# Patient Record
Sex: Female | Born: 1975 | Race: White | Hispanic: No | Marital: Married | Smoking: Never smoker
Health system: Southern US, Community
[De-identification: ages and names within clinical notes are randomized; demographics above are authoritative.]

## PROBLEM LIST (undated history)

## (undated) DIAGNOSIS — M1990 Unspecified osteoarthritis, unspecified site: Secondary | ICD-10-CM

## (undated) DIAGNOSIS — R519 Headache, unspecified: Secondary | ICD-10-CM

## (undated) DIAGNOSIS — Z789 Other specified health status: Secondary | ICD-10-CM

## (undated) DIAGNOSIS — R51 Headache: Secondary | ICD-10-CM

## (undated) HISTORY — DX: Unspecified osteoarthritis, unspecified site: M19.90

## (undated) HISTORY — PX: KNEE ARTHROSCOPY W/ ACL RECONSTRUCTION: SHX1858

## (undated) HISTORY — PX: TUBAL LIGATION: SHX77

---

## 1998-02-26 ENCOUNTER — Encounter: Admission: RE | Admit: 1998-02-26 | Discharge: 1998-02-26 | Payer: Self-pay | Admitting: Sports Medicine

## 1998-04-04 ENCOUNTER — Encounter: Admission: RE | Admit: 1998-04-04 | Discharge: 1998-04-04 | Payer: Self-pay | Admitting: Family Medicine

## 1998-05-08 ENCOUNTER — Encounter: Admission: RE | Admit: 1998-05-08 | Discharge: 1998-05-08 | Payer: Self-pay | Admitting: Family Medicine

## 1998-06-25 ENCOUNTER — Encounter: Admission: RE | Admit: 1998-06-25 | Discharge: 1998-06-25 | Payer: Self-pay | Admitting: Family Medicine

## 1998-09-25 ENCOUNTER — Encounter: Admission: RE | Admit: 1998-09-25 | Discharge: 1998-09-25 | Payer: Self-pay | Admitting: Family Medicine

## 1999-03-19 ENCOUNTER — Other Ambulatory Visit: Admission: RE | Admit: 1999-03-19 | Discharge: 1999-03-19 | Payer: Self-pay | Admitting: Obstetrics & Gynecology

## 1999-03-24 ENCOUNTER — Ambulatory Visit (HOSPITAL_COMMUNITY): Admission: RE | Admit: 1999-03-24 | Discharge: 1999-03-24 | Payer: Self-pay | Admitting: Obstetrics & Gynecology

## 1999-03-24 ENCOUNTER — Encounter: Payer: Self-pay | Admitting: Obstetrics & Gynecology

## 1999-08-26 ENCOUNTER — Encounter: Payer: Self-pay | Admitting: Obstetrics and Gynecology

## 1999-08-26 ENCOUNTER — Ambulatory Visit (HOSPITAL_COMMUNITY): Admission: RE | Admit: 1999-08-26 | Discharge: 1999-08-26 | Payer: Self-pay | Admitting: Obstetrics and Gynecology

## 2000-01-14 ENCOUNTER — Inpatient Hospital Stay (HOSPITAL_COMMUNITY): Admission: AD | Admit: 2000-01-14 | Discharge: 2000-01-17 | Payer: Self-pay | Admitting: Obstetrics & Gynecology

## 2000-03-23 ENCOUNTER — Other Ambulatory Visit: Admission: RE | Admit: 2000-03-23 | Discharge: 2000-03-23 | Payer: Self-pay | Admitting: Obstetrics & Gynecology

## 2000-12-14 ENCOUNTER — Emergency Department (HOSPITAL_COMMUNITY): Admission: EM | Admit: 2000-12-14 | Discharge: 2000-12-14 | Payer: Self-pay | Admitting: Emergency Medicine

## 2001-03-22 ENCOUNTER — Other Ambulatory Visit: Admission: RE | Admit: 2001-03-22 | Discharge: 2001-03-22 | Payer: Self-pay | Admitting: Obstetrics & Gynecology

## 2001-08-25 ENCOUNTER — Ambulatory Visit (HOSPITAL_COMMUNITY): Admission: RE | Admit: 2001-08-25 | Discharge: 2001-08-25 | Payer: Self-pay | Admitting: Obstetrics & Gynecology

## 2001-08-25 ENCOUNTER — Encounter: Payer: Self-pay | Admitting: Obstetrics & Gynecology

## 2001-12-31 ENCOUNTER — Inpatient Hospital Stay (HOSPITAL_COMMUNITY): Admission: AD | Admit: 2001-12-31 | Discharge: 2002-01-03 | Payer: Self-pay | Admitting: Obstetrics and Gynecology

## 2002-07-18 ENCOUNTER — Other Ambulatory Visit: Admission: RE | Admit: 2002-07-18 | Discharge: 2002-07-18 | Payer: Self-pay | Admitting: Obstetrics and Gynecology

## 2002-10-11 ENCOUNTER — Ambulatory Visit (HOSPITAL_COMMUNITY): Admission: RE | Admit: 2002-10-11 | Discharge: 2002-10-11 | Payer: Self-pay | Admitting: Obstetrics & Gynecology

## 2002-10-11 ENCOUNTER — Encounter: Payer: Self-pay | Admitting: Obstetrics & Gynecology

## 2003-02-13 ENCOUNTER — Encounter (INDEPENDENT_AMBULATORY_CARE_PROVIDER_SITE_OTHER): Payer: Self-pay | Admitting: *Deleted

## 2003-02-13 ENCOUNTER — Inpatient Hospital Stay (HOSPITAL_COMMUNITY): Admission: AD | Admit: 2003-02-13 | Discharge: 2003-02-16 | Payer: Self-pay | Admitting: Obstetrics & Gynecology

## 2004-07-22 ENCOUNTER — Emergency Department (HOSPITAL_COMMUNITY): Admission: AC | Admit: 2004-07-22 | Discharge: 2004-07-22 | Payer: Self-pay

## 2004-11-08 ENCOUNTER — Emergency Department (HOSPITAL_COMMUNITY): Admission: EM | Admit: 2004-11-08 | Discharge: 2004-11-09 | Payer: Self-pay | Admitting: Emergency Medicine

## 2006-12-31 ENCOUNTER — Emergency Department (HOSPITAL_COMMUNITY): Admission: EM | Admit: 2006-12-31 | Discharge: 2006-12-31 | Payer: Self-pay | Admitting: *Deleted

## 2009-03-02 ENCOUNTER — Emergency Department (HOSPITAL_COMMUNITY): Admission: EM | Admit: 2009-03-02 | Discharge: 2009-03-02 | Payer: Self-pay | Admitting: Emergency Medicine

## 2009-04-29 ENCOUNTER — Encounter: Admission: RE | Admit: 2009-04-29 | Discharge: 2009-04-29 | Payer: Self-pay | Admitting: Family Medicine

## 2010-06-09 LAB — CBC
Hemoglobin: 12.9 g/dL (ref 12.0–15.0)
RBC: 4.38 MIL/uL (ref 3.87–5.11)

## 2010-06-09 LAB — URINALYSIS, ROUTINE W REFLEX MICROSCOPIC
Bilirubin Urine: NEGATIVE
Ketones, ur: NEGATIVE mg/dL
Nitrite: NEGATIVE
Protein, ur: 30 mg/dL — AB
Urobilinogen, UA: 1 mg/dL (ref 0.0–1.0)

## 2010-06-09 LAB — COMPREHENSIVE METABOLIC PANEL
ALT: 16 U/L (ref 0–35)
AST: 18 U/L (ref 0–37)
Alkaline Phosphatase: 57 U/L (ref 39–117)
BUN: 12 mg/dL (ref 6–23)
CO2: 25 mEq/L (ref 19–32)
Chloride: 105 mEq/L (ref 96–112)
GFR calc non Af Amer: >60 mL/min (ref >60)
Glucose, Bld: 101 mg/dL — ABNORMAL HIGH (ref 70–99)
Total Bilirubin: 0.4 mg/dL (ref 0.3–1.2)
Total Protein: 6.8 g/dL (ref 6.0–8.3)

## 2010-06-09 LAB — URINE MICROSCOPIC-ADD ON

## 2010-06-09 LAB — DIFFERENTIAL
Eosinophils Relative: 0 % (ref 0–5)
Monocytes Absolute: 0.2 10*3/uL (ref 0.1–1.0)
Neutro Abs: 6.1 10*3/uL (ref 1.7–7.7)
Neutrophils Relative %: 92 % — ABNORMAL HIGH (ref 43–77)

## 2010-06-09 LAB — POCT PREGNANCY, URINE: Preg Test, Ur: NEGATIVE

## 2010-07-25 NOTE — Discharge Summary (Signed)
[facility]  Patient:    Kaitlin Cole, Kaitlin Cole                      MRN: [id number] Adm. Date:  [id number] Disc. Date: [id number] Attending:  Mickle Mallory Dictator:   Leilani Able, P.A.                           Discharge Summary  FINAL DIAGNOSES:              Intrauterine pregnancy at term, history of previous cesarean section, desires repeat cesarean section, morbid obesity.  PROCEDURE:                    Repeat low transverse cesarean section.  SURGEON:                      Gerrit Friends. Aldona Bar, M.D.  ASSISTANT:                    Luvenia Redden, M.D.  COMPLICATIONS:                None.                                This 35 year old G2, P1 presents on November 7 for repeat cesarean section.  Patient had had a history of prior cesarean section with her last pregnancy in 1995 secondary to arrest of dilation. Patients prenatal course had been uncomplicated.  This was a Clomid pregnancy and patients rubella status was not immune.  She was taken to the operating room on January 14, 2000 by Dr. Annamaria Helling where a repeat low transverse cesarean section was performed with the delivery of a 7 pound 15 ounce female infant with Apgars of 9 and 9.  Delivery went without complication.  Patients postoperative course was benign without significant fevers.  Patient did receive her rubella vaccine prior to discharge.  Patient was sent home on a regular diet.  Told to decrease activity.  Told to continue prenatal vitamins and FeSo4 325 mg one b.i.d.  Was given Motrin 600 mg one q.6h.  Was also given erythromycin 333 mg one t.i.d. x 7 days secondary to slight cellulitis of her incision.  She was told to return to the office in three days for staple removal and to call with any increased pain, fever, or drainage from her incision. DD:  02/13/00 TD:  02/13/00 Job: [id number]

## 2010-07-25 NOTE — Op Note (Signed)
Kaitlin Cole, Kaitlin Cole                         ACCOUNT NO.:  [id number]   MEDICAL RECORD NO.:  [id number]                   PATIENT TYPE:  INP   LOCATION:  9105                                 FACILITY:  [facility]   PHYSICIAN:  Gerrit Friends. Aldona Bar, M.D.                DATE OF BIRTH:  1975/07/08   DATE OF PROCEDURE:  12/31/2001  DATE OF DISCHARGE:                                 OPERATIVE REPORT   PREOPERATIVE DIAGNOSES:  1. Intrauterine pregnancy at 38-39 weeks.  2. Ruptured membranes.  3. Early active labor.  4. Previous cesarean section x2.  5. Morbid obesity.   POSTOPERATIVE DIAGNOSES:  1. Intrauterine pregnancy at 38-39 weeks.  2. Ruptured membranes.  3. Early active labor.  4. Previous cesarean section x2.  5. Morbid obesity.  6. Delivery of 8 pound 4 ounce female infant, Apgars 9 and 9.   PROCEDURE:  Repeat low transverse cesarean section.   SURGEON:  Gerrit Friends. Aldona Bar, M.D.   ASSISTANT:  Randye Lobo, M.D.   ANESTHESIA:  Spinal/epidural - Dr. Arby Barrette   INDICATIONS:  This gravida 3, para 34, 35 year old patient was scheduled for  a repeat cesarean section on January 03, 2002, but on the early morning of  December 31, 2001, encountered ruptured membranes with the onset of  contractions.  She presented to [facility] for evaluation and indeed  was found to have ruptured membranes and to be in early active labor.  Fetal  heart tracing was reassuring.   She was taken to the operating room for repeat cesarean section.   DESCRIPTION OF PROCEDURE:  In the operating room, Dr. Arby Barrette placed a  spinal and augmented this with placement of an epidural.  She was positioned  in the usual fashion and thereafter prepped and draped with a Foley catheter  inserted as part of the prep.  The panniculus was taped up to facilitate the  procedure.  Once the patient was adequately draped and good anesthetic  levels were noted, the procedure was begun.  A Pfannenstiel incision was  made  through the old scar and dissected down sharply to and through the  fascia in a low transverse fashion.  Subfascial space was created inferiorly  and superiorly and muscles separated in the midline and peritoneum  identified.  There were some adhesions encountered, mostly omental, and  these were taken down adequately.  Thereafter, the vesicouterine peritoneum  once the peritoneal cavity had been entered was incised in a low transverse  fashion and thereafter using the Metzenbaum scissors the uterine wall opened  in the low transverse fashion and extended laterally with the fingers.  Amniotomy, remaining fluid - was noted to be clear and thereafter with the  aid of the vacuum extractor, a viable 8 pound 4 ounce female infant was  delivered with minimal difficulty.  The baby had Apgars of 9 and 9.  After  the cord was  clamped and cut, the infant was passed off to the awaiting team  headed up by Dr. Alison Murray, and subsequently, the baby was taken to the nursery  in good condition.   Subsequent weight was found to be 8 pounds 4 ounces.   After the cord blood was collected, the placenta was delivered intact.  The  uterus was then exteriorized and manually rendered free of any remaining  products of conception.  The lower segment was noted to be somewhat thin as  expected.  Nonetheless it was closed in the usual fashion with #1 Vicryl in  a running locking fashion.  Several figure-of-eight #1 Vicryl were applied  for additional hemostasis.   At this time, with good uterine contractility noted and good uterine  hemostasis at the incision noted, tubes and ovaries were inspected and found  to be normal.  Abdomen was lavaged of all free blood and clot and then the  uterus was replaced into the abdominal cavity.  At this time, the counts  were noted to be correct and no foreign bodies were noted to be remaining in  the abdominal cavity.  Closure of the abdomen at this time was carried out  in layers.   The abdominal peritoneum was closed with 2-0 Vicryl in a running  fashion, muscles secured with same.  Assured of good subfascial hemostasis,  the fascia was then reapproximated from the angle to the midline bilaterally  using 0 Vicryl in a running fashion.   The subcutaneous tissue was rendered hemostatic and reapproximated with 2-0  plain in an interrupted subcuticular fashion.  Staples were then used to  close the skin, and a sterile pressure dressing was applied.  At this time,  the patient was transported to the recovery room in satisfactory condition  having tolerated the procedure well.  Estimated blood loss was 500 cc.  All  counts were correct x2.  At the conclusion of the procedure, both mother and  baby were doing well in their respective recovery areas.                                                Gerrit Friends. Aldona Bar, M.D.    RMW/MEDQ  D:  12/31/2001  T:  01/01/2002  Job:  [id number]

## 2010-07-25 NOTE — Op Note (Signed)
Kaitlin Cole, Kaitlin Cole                         ACCOUNT NO.:  [id number]   MEDICAL RECORD NO.:  [id number]                   PATIENT TYPE:  INP   LOCATION:  9131                                 FACILITY:  [facility]   PHYSICIAN:  Miguel Aschoff, M.D.                    DATE OF BIRTH:  1975/04/11   DATE OF PROCEDURE:  02/13/2003  DATE OF DISCHARGE:                                 OPERATIVE REPORT   PREOPERATIVE DIAGNOSES:  1. Intrauterine pregnancy at 38 weeks.  2. Spontaneous rupture of membranes.  3. Previous cesarean section.  4. Desired sterilization.  5. Positive group B Streptococcus.   POSTOPERATIVE DIAGNOSES:  1. Intrauterine pregnancy at 38 weeks.  2. Spontaneous rupture of membranes.  3. Previous cesarean section.  4. Desired sterilization.  5. Positive group B Streptococcus.  6. Delivery of a viable female infant, Apgar 8 and 9.   PROCEDURES:  1. Repeat low flap transverse cesarean section.  2. Bilateral Pomeroy tubal sterilization.   SURGEON:  Miguel Aschoff, M.D.   ASSISTANT:  Carrington Clamp, M.D.   ANESTHESIA:  Spinal.   COMPLICATIONS:  None.   JUSTIFICATION:  The patient is a 35 year old white female, gravida 5, para 3-  0-1-3, with an estimated date of confinement of March 03, 2003.  The  patient was scheduled for repeat cesarean section December 9.  She developed  spontaneous rupture of membranes on the morning of February 13, 2003.  Examination revealed grossly clear fluid.  The patient has a previous  history of being positive for group B strep, and this is being prophylaxed  with Clindamycin because of allergies to PENICILLIN.  She also has requested  that a permanent sterilization procedure be performed.  Informed consent has  been obtained.   DESCRIPTION OF PROCEDURE:  The patient was taken to the operating room and  placed in sitting position, and spinal anesthesia was administered without  difficulty.  She was then placed in the supine position deviated to  the left  and prepped and draped in the usual sterile fashion.  A Foley catheter was  inserted.  Once this was done her previous Pfannenstiel incision was re-  incised and extended down through the subcutaneous tissue.  Bleeding points  were clamped and coagulated as they were encountered.  The fascia was then  identified, incised transversely, and separated from the underlying rectus  muscles.  Rectus muscles were divided in the midline.  The peritoneum was  then identified and entered, carefully avoiding the underlying structures.  At this point a bladder flap was created, protected with a bladder blade,  then a typical transverse incision was made into the lower uterine segment.  The amniotic cavity was entered, clear fluid was obtained and with vacuum  extractor assistance, the patient was delivered of a viable female infant,  Apgar 8 at one minute and 9 at five minutes from an LOP position.  Nose and  mouth were suctioned and the baby handed to the pediatric team in  attendance.  Cord bloods were then obtained for appropriate studies.  The  placenta was delivered and then the uterus was evacuated of any remaining  products of conception.  At this point the angles of the uterine incision  were identified, ligated using figure-of-eight sutures of #1 Vicryl, then  the uterus was closed in layers.  The first layer was a running interlocking  suture of #1 Vicryl, followed by an imbricating suture of #1 Vicryl.  Once  this was done there was one area of oozing noted on the left outer third of  the uterine incision.  This was promptly brought under control using two  figure-of-eight sutures of #1 Vicryl.  Attention was then directed to the  right tube.  It was grasped in its midportion.  A knuckle of tube was thus  created and this knuckle of tube was then ligated using two ligatures of 0  plain gut.  The area above the ligatures was then excised and tubal stumps  were cauterized.  A small area  of oozing was noted, and this was grasped and  tied with ligature of 0 plain gut.  Hemostasis was then readily achieved.  The left tube was identified, grasped in its midportion, and a knuckle of  tube thus created, and then again this was doubly ligated with two ligatures  of 0 plain gut and the area above the ligatures excised and the tubal stumps  cauterized.  At this point with no other abnormalities being noted with good  hemostasis, lap counts and instrument counts were found to be correct and  then the abdomen was closed.  The parietal peritoneum was closed using  running and continuous 0 Vicryl suture, rectus muscles were reapproximated  using running and continuous 0 Vicryl suture, the fascia was then closed  using two sutures of 0 Vicryl at each side of the lateral fascial angles and  meeting in the midline.  The subcutaneous tissue was closed using  interrupted 0 plain gut sutures and the skin incision was closed using  staples.  The estimated blood loss was approximately 800 mL.  The patient  tolerated the procedure well and went to the recovery room in satisfactory  condition.                                               Miguel Aschoff, M.D.    AR/MEDQ  D:  02/13/2003  T:  02/14/2003  Job:  [id number]

## 2010-07-25 NOTE — Op Note (Signed)
[facility]  Patient:    Kaitlin Cole, Kaitlin Cole                      MRN: [id number] Proc. Date: 01/14/00 Adm. Date:  [id number] Attending:  Mickle Mallory                           Operative Report  PREOPERATIVE DIAGNOSES:       Term pregnancy, previous cesarean section, desire for repeat cesarean section, morbid obesity.  POSTOPERATIVE DIAGNOSES:      Term pregnancy, previous cesarean section, desire for repeat cesarean section, morbid obesity, delivery of 7 pound 15 ounce female infant with Apgars 8 and 9.  PROCEDURE:                    Repeat low transverse cesarean section.  SURGEON:                      Gerrit Friends. Aldona Bar, M.D.  ASSISTANT:                    Luvenia Redden, M.D.  ANESTHESIA:                   Spinal-Dr. Arby Barrette.  PROCEDURE:                    Patient was taken to the operating room and after satisfactory induction of spinal anesthesia she was prepped and draped having placed in a supine position slightly tilted to the left.  A Foley catheter was inserted as part of the prep.                                After the patient was adequately draped, anesthetic levels were checked and found to be adequate.  At this time procedure was begun.                                A Pfannenstiel incision was made and with minimal difficulty dissected down to and through the fascia in a low transverse fashion.  Hemostasis was created at each layer.                                The subfascial space was created inferiorly and superiorly.  Muscles separated in the midline.  Peritoneum identified and entered appropriately with care taken to avoid the bowel superiorly and the bladder inferiorly.  At this time the vesicouterine peritoneum was incised in a low transverse fashion and pushed off the lower uterine segment with ease. Sharp incision with the use of Metzenbaum scissors was then made in the low transverse fashion, extended with the  fingers.  Amniotomy was produced with production of clear fluid.  The vertex was presenting, but floating.  One attempt was made to use the vacuum extractor but it released prematurely so therefore the short Simpson forceps were placed and with minimal difficulty thereafter delivery of a viable female infant was carried out.  Apgars were noted to be 8 and 9.  After the cord was clamped and cut the infant was passed off to the awaiting team.  Subsequent weight was noted to be 7 pounds 15 ounces and  infant was taken to the nursery in good condition.                                The umbilical cord appeared normal; had two arteries and one vein and after cord blood was collected placenta was delivered intact.  The uterus was then exteriorized.  It was manually rendered free of any remaining products of conception.  Good contractility was afforded with slowly giving intravenous Pitocin and manual stimulation.  Closure of the uterine incision was then carried out using #1 Vicryl in a running locking fashion and this was reinforced in the midline with a figure-of-eight #1 Vicryl with adequate hemostasis.  At this time the uterus was well contracted, uterine incision dry.  Tubes and ovaries inspected and noted to be normal. Cul-de-sac freed of all blood and clot.  Uterus replaced into the abdominal cavity.  After all counts were noted to be correct and no foreign bodies were noted to be remaining in the abdominal cavity closure of the abdomen was then carried out in layers.  The abdominal peritoneum was closed with 2-0 Vicryl in a running fashion.  Subfascial spaces rendered hemostatic and then fascia was closed with 0 Vicryl from angle to midline bilaterally.  ______ was rendered hemostatic and reapproximated with 2-0 plain in an interrupted fashion and thereafter staples were applied and thereafter a sterile pressure dressing was applied.  The patient at this time was transported to the  recovery area in satisfactory condition having tolerated procedure well.  Estimated blood loss 500 cc.  All counts correct x 2.  Patient had good clear urine output during the procedure.  At the conclusion of the procedure both mother and baby were doing well in their respective recovery areas. DD:  01/14/00 TD:  01/14/00 Job: [id number]

## 2010-07-25 NOTE — Discharge Summary (Signed)
   Kaitlin Cole, Kaitlin Cole                         ACCOUNT NO.:  [id number]   MEDICAL RECORD NO.:  [id number]                   PATIENT TYPE:  INP   LOCATION:  9105                                 FACILITY:  [facility]   PHYSICIAN:  Malva Limes, M.D.                 DATE OF BIRTH:  1975-05-14   DATE OF ADMISSION:  12/31/2001  DATE OF DISCHARGE:  01/03/2002                                 DISCHARGE SUMMARY   FINAL DIAGNOSES:  1. Intrauterine pregnancy at 38+ weeks gestation.  2. History of previous cesarean section x2; desires repeat cesarean section.  3. Active labor.  4. Morbid obesity.  5. Delivery of  female infant with Apgars of 9 and 9.   PROCEDURE:  Repeat low transverse cesarean section.   SURGEON:  Gerrit Friends. Aldona Bar, M.D.   ASSISTANT:  Randye Lobo, M.D.   COMPLICATIONS:  None.   HOSPITAL COURSE:  This 35 year old G3 P2-0-0-2 presents at 38+ weeks with  ruptured membranes in active labor.  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  She had morbid obesity and a PENICILLIN allergy.  Upon admission  the patient desired repeat cesarean section and the risks and benefits were  discussed with the patient.  She was taken to the operating room by Dr. Aldona Bar  where a repeat low transverse cesarean section was performed with the  delivery of an 8 pound  4 ounce female infant with Apgars of 9 and 9.  The delivery went without  complication.  The patient's postoperative course was benign without  significant fevers.  She was felt ready for discharge on postoperative day  #3.   DISPOSITION:  1. Sent home on a regular diet.  2. Told to decrease activities.  3. Told to continue prenatal vitamins.  4. Given a prescription for Tylox one to two q.4h. as needed for pain.  5. Told to follow up in the office in four weeks.   Her little boy was circumcised before discharge.     Leilani Able, P.A.-C.                 Malva Limes, M.D.    MB/MEDQ  D:  02/06/2002  T:  02/07/2002  Job:  [id number]

## 2010-07-25 NOTE — Discharge Summary (Signed)
NAMERONESHIA, DREW                         ACCOUNT NO.:  [id number]   MEDICAL RECORD NO.:  [id number]                   PATIENT TYPE:  INP   LOCATION:  9131                                 FACILITY:  [facility]   PHYSICIAN:  Carrington Clamp, M.D.              DATE OF BIRTH:  Oct 09, 1975   DATE OF ADMISSION:  02/13/2003  DATE OF DISCHARGE:  02/16/2003                                 DISCHARGE SUMMARY   FINAL DIAGNOSES:  1. Intrauterine pregnancy at [redacted] weeks gestation.  2. Spontaneous rupture of membranes.  3. History of previous cesarean section desires repeat cesarean section and     desires permanent elective sterilization.  4. Positive group B streptococcus culture.   PROCEDURE:  Repeat low flap transverse cesarean section and a bilateral  Pomeroy tubal sterilization procedure.  Surgeon Dr. Miguel Aschoff.  Assistant  Dr. Carrington Clamp.  Complications none.   HOSPITAL COURSE:  This 35 year old G5, P3-0-1-3 presents at [redacted] weeks  gestation with spontaneous rupture of membranes.  Patient had a positive  group B strep culture performed in the office and was started on  clindamycin.  She expressed her continued desire for a postpartum tubal  ligation.  Patient's antepartum course had been complicated by her history  of three cesarean sections, her positive group B strep; otherwise, patient's  antepartum course had been uncomplicated.  She was taken to the operating  room on February 13, 2003 by Dr. Miguel Aschoff where a repeat low transverse  cesarean section was performed with the delivery of an 8-pound 1-ounce female  infant with Apgars of 8 and 9, the delivery went without complications.  Patient's postoperative course was benign without significant fever, she was  felt ready for discharge on postoperative day #3, she was sent home on a  regular diet, told to decrease activities, told to continue prenatal  vitamins, was given Percocet one to two every 4 hours as needed for pain,  told she  could use over-the-counter ibuprofen as needed, was to follow up in  the office in 4 weeks.   LABORATORIES ON DISCHARGE:  Patient had a hemoglobin of 10.6, white blood  cell count of 10.     Leilani Able, P.A.-C.                Carrington Clamp, M.D.    MB/MEDQ  D:  03/19/2003  T:  03/19/2003  Job:  [id number]

## 2010-12-17 LAB — CBC
HCT: 39.9
MCHC: 33.7
Platelets: 279
RBC: 4.67
RDW: 12.3
WBC: 6.2

## 2010-12-17 LAB — URINALYSIS, ROUTINE W REFLEX MICROSCOPIC
Specific Gravity, Urine: 1.013
Urobilinogen, UA: 0.2

## 2010-12-17 LAB — I-STAT 8, (EC8 V) (CONVERTED LAB)
Chloride: 106
Glucose, Bld: 94
Operator id: 272551
Potassium: 3.8
Sodium: 140
pCO2, Ven: 43.3 — ABNORMAL LOW
pH, Ven: 7.402 — ABNORMAL HIGH

## 2010-12-17 LAB — POCT CARDIAC MARKERS: Troponin i, poc: <0.05

## 2010-12-17 LAB — DIFFERENTIAL
Basophils Absolute: 0
Eosinophils Absolute: 0.1
Eosinophils Relative: 1
Lymphocytes Relative: 30
Monocytes Absolute: 0.4
Neutrophils Relative %: 61

## 2010-12-17 LAB — POCT PREGNANCY, URINE
Operator id: 272551
Preg Test, Ur: NEGATIVE

## 2011-10-02 ENCOUNTER — Inpatient Hospital Stay (HOSPITAL_COMMUNITY): Payer: Self-pay

## 2011-10-02 ENCOUNTER — Encounter (HOSPITAL_COMMUNITY): Payer: Self-pay | Admitting: *Deleted

## 2011-10-02 ENCOUNTER — Inpatient Hospital Stay (HOSPITAL_COMMUNITY)
Admission: AD | Admit: 2011-10-02 | Discharge: 2011-10-02 | Disposition: A | Discharge status: Home / Self Care | Payer: Self-pay | Source: Ambulatory Visit | Attending: Obstetrics & Gynecology | Admitting: Obstetrics & Gynecology

## 2011-10-02 DIAGNOSIS — R109 Unspecified abdominal pain: Secondary | ICD-10-CM | POA: Diagnosis present

## 2011-10-02 DIAGNOSIS — Z98891 History of uterine scar from previous surgery: Secondary | ICD-10-CM

## 2011-10-02 DIAGNOSIS — N643 Galactorrhea not associated with childbirth: Secondary | ICD-10-CM | POA: Insufficient documentation

## 2011-10-02 DIAGNOSIS — R102 Pelvic and perineal pain: Secondary | ICD-10-CM

## 2011-10-02 HISTORY — DX: Other specified health status: Z78.9

## 2011-10-02 LAB — URINALYSIS, ROUTINE W REFLEX MICROSCOPIC
Bilirubin Urine: NEGATIVE
Glucose, UA: NEGATIVE mg/dL
Nitrite: NEGATIVE
Protein, ur: NEGATIVE mg/dL
Urobilinogen, UA: 0.2 mg/dL (ref 0.0–1.0)
pH: 5.5 (ref 5.0–8.0)

## 2011-10-02 LAB — CBC WITH DIFFERENTIAL/PLATELET
Eosinophils Absolute: 0.1 10*3/uL (ref 0.0–0.7)
Eosinophils Relative: 2 % (ref 0–5)
Hemoglobin: 12.3 g/dL (ref 12.0–15.0)
MCH: 27.8 pg (ref 26.0–34.0)
Monocytes Relative: 6 % (ref 3–12)
RBC: 4.43 MIL/uL (ref 3.87–5.11)
RDW: 12.7 % (ref 11.5–15.5)
WBC: 6.2 10*3/uL (ref 4.0–10.5)

## 2011-10-02 LAB — URINE MICROSCOPIC-ADD ON

## 2011-10-02 LAB — WET PREP, GENITAL

## 2011-10-02 NOTE — MAU Note (Signed)
Starting Tues, period started. Has been having cramping like contractions and pain in lower back.  Has been in the bathroom all day. (peeing)  Feels like she can't hold it.

## 2011-10-02 NOTE — MAU Provider Note (Signed)
History     CSN: [id number]  Arrival date and time: 10/02/11 4098   First Provider Initiated Contact with Patient 10/02/11 2017      Chief Complaint  Patient presents with  . Back Pain   HPI This is a 36 y.o. female who presents with a 3 day history of lower abdominal cramping and low back cramping. States feels like contractions. Period started 3 days ago. Also has had feelings of "something moving" inside her belly as well as fluid leaking from breasts since March. Does admit to breast manipulation, but states the first time it happened, she was sitting watching a movie and not touching her breasts.   Denies fever, nausea or vomiting. Denies loss of appetite or changes in bowel habits.  Has not had a gynecological exam in 9 years. Last baby was a 4th C/S in 2004.  Had a BTL then.  Has had a physical exam with her Family Medicine doctor within the last year.  OB History    Grav Para Term Preterm Abortions TAB SAB Ect Mult Living   5 4 4  1 1    4       Past Medical History  Diagnosis Date  . No pertinent past medical history     Past Surgical History  Procedure Date  . Cesarean section   . Knee arthroscopy w/ acl reconstruction 1991 and 1995    Both done at separate times  . Tubal ligation     Family History  Problem Relation Age of Onset  . Other Neg Hx     History  Substance Use Topics  . Smoking status: Never Smoker   . Smokeless tobacco: Never Used  . Alcohol Use: No    Allergies:  Allergies  Allergen Reactions  . Penicillins Rash    No prescriptions prior to admission    ROS See HPI  Physical Exam   Blood pressure 119/68, pulse 96, temperature 98 F (36.7 C), temperature source Oral, resp. rate 20, height 5\' 3"  (1.6 m), weight 273 lb (123.832 kg), last menstrual period 09/28/2011.  Physical Exam  Constitutional: She is oriented to person, place, and time. She appears well-developed and well-nourished. No distress.  HENT:  Head:  Normocephalic.  Cardiovascular: Normal rate.   Respiratory: Effort normal.  GI: Soft. She exhibits no distension and no mass. There is tenderness (slight tenderness over suprapubic area). There is no rebound and no guarding.  Genitourinary: Uterus normal. Vaginal discharge (small blood at cervix) found.       Cervix stenotic.  Uterus small, but uterus and adnexa very difficult to palpate due to obese abdomen.  Musculoskeletal: Normal range of motion.  Neurological: She is alert and oriented to person, place, and time.  Skin: Skin is warm and dry.  Psychiatric: She has a normal mood and affect.   UPT Neg UA Neg except Hgb (on menses)  MAU Course  Procedures  MDM Will check CBC and [place]. >> Results for orders placed during the hospital encounter of 10/02/11 (from the past 24 hour(s))  URINALYSIS, ROUTINE W REFLEX MICROSCOPIC     Status: Abnormal   Collection Time   10/02/11  6:35 PM      Component Value Range   Color, Urine STRAW (*) YELLOW   APPearance CLEAR  CLEAR   Specific Gravity, Urine <1.005 (*) 1.005 - 1.030   pH 5.5  5.0 - 8.0   Glucose, UA NEGATIVE  NEGATIVE mg/dL   Hgb urine dipstick MODERATE (*) NEGATIVE  Bilirubin Urine NEGATIVE  NEGATIVE   Ketones, ur NEGATIVE  NEGATIVE mg/dL   Protein, ur NEGATIVE  NEGATIVE mg/dL   Urobilinogen, UA 0.2  0.0 - 1.0 mg/dL   Nitrite NEGATIVE  NEGATIVE   Leukocytes, UA NEGATIVE  NEGATIVE  URINE MICROSCOPIC-ADD ON     Status: Abnormal   Collection Time   10/02/11  6:35 PM      Component Value Range   Squamous Epithelial / LPF FEW (*) RARE   Bacteria, UA RARE  RARE  POCT PREGNANCY, URINE     Status: Normal   Collection Time   10/02/11  6:42 PM      Component Value Range   Preg Test, Ur NEGATIVE  NEGATIVE  WET PREP, GENITAL     Status: Abnormal   Collection Time   10/02/11  8:30 PM      Component Value Range   Yeast Wet Prep HPF POC NONE SEEN  NONE SEEN   Trich, Wet Prep NONE SEEN  NONE SEEN   Clue Cells Wet Prep HPF POC FEW (*)  NONE SEEN   WBC, Wet Prep HPF POC FEW (*) NONE SEEN  CBC WITH DIFFERENTIAL     Status: Normal   Collection Time   10/02/11  8:46 PM      Component Value Range   WBC 6.2  4.0 - 10.5 K/uL   RBC 4.43  3.87 - 5.11 MIL/uL   Hemoglobin 12.3  12.0 - 15.0 g/dL   HCT 16.1  09.6 - 04.5 %   MCV 84.7  78.0 - 100.0 fL   MCH 27.8  26.0 - 34.0 pg   MCHC 32.8  30.0 - 36.0 g/dL   RDW 40.9  81.1 - 91.4 %   Platelets 207  150 - 400 K/uL   Neutrophils Relative 60  43 - 77 %   Neutro Abs 3.7  1.7 - 7.7 K/uL   Lymphocytes Relative 33  12 - 46 %   Lymphs Abs 2.0  0.7 - 4.0 K/uL   Monocytes Relative 6  3 - 12 %   Monocytes Absolute 0.4  0.1 - 1.0 K/uL   Eosinophils Relative 2  0 - 5 %   Eosinophils Absolute 0.1  0.0 - 0.7 K/uL   Basophils Relative 1  0 - 1 %   Basophils Absolute 0.0  0.0 - 0.1 K/uL   US Transvaginal Non-ob  10/02/2011  *RADIOLOGY REPORT*  Clinical Data: Lower abdominal pain  TRANSABDOMINAL AND TRANSVAGINAL ULTRASOUND OF PELVIS Technique:  Both transabdominal and transvaginal ultrasound examinations of the pelvis were performed. Transabdominal technique was performed for global imaging of the pelvis including uterus, ovaries, adnexal regions, and pelvic cul-de-sac.  It was necessary to proceed with endovaginal exam following the transabdominal exam to visualize the adnexa.  Comparison:  None  Findings:  Uterus: 11.4 x 4.3 x 5.3 cm.  The uterus is partially flexed limiting itsevaluation.  Bowel gas does obscure portions of the uterus.  Endometrium: 6 mm in thickness and uniform.  Portions are difficult to visualize.  Right ovary:  2.5 x 1.4 x 2.0 cm in within normal limits.  Left ovary: Not visualized.  Other findings: No free fluid  IMPRESSION: The uterus and endometrial stripe were difficult to visualize and partially obscured.  The left ovary was also obscured.  Study was otherwise within normal limits.  Original Report Authenticated By: Donavan Burnet, M.D.     Assessment and Plan  A:   Abdominal pain, suspect dysmenorrhea  vs bowel gas (normal [place], normal UA, Normal WBC)      Galactorrhea, bilateral  P:  Discussed results       Recommend getting Family Doctor to run a Prolactin level (after 48 hrs of no manipulation) and breast US.  Recommend [facility]       Reassured no obvious pathology to explain pelvic pain      Declines pain medicine. Will try ibuprofen      Discharge home      Follow up with Family Doctor  [facility] 10/02/2011, 8:40 PM

## 2011-10-03 LAB — GC/CHLAMYDIA PROBE AMP, GENITAL: Chlamydia, DNA Probe: NEGATIVE

## 2012-06-13 ENCOUNTER — Other Ambulatory Visit: Payer: Self-pay | Admitting: Family Medicine

## 2012-06-13 NOTE — Telephone Encounter (Signed)
Ok to refill #30

## 2012-06-13 NOTE — Telephone Encounter (Signed)
Ok to refill 

## 2012-06-14 NOTE — Telephone Encounter (Signed)
Faxed rx to pharm

## 2012-06-14 NOTE — Telephone Encounter (Signed)
rx prescription signed called pt to pick up

## 2014-01-08 ENCOUNTER — Encounter (HOSPITAL_COMMUNITY): Payer: Self-pay | Admitting: *Deleted

## 2015-11-14 ENCOUNTER — Encounter (HOSPITAL_COMMUNITY): Payer: Self-pay | Admitting: *Deleted

## 2015-11-14 ENCOUNTER — Inpatient Hospital Stay (HOSPITAL_COMMUNITY)
Admission: AD | Admit: 2015-11-14 | Discharge: 2015-11-14 | Disposition: A | Discharge status: Home / Self Care | Payer: Self-pay | Source: Ambulatory Visit | Attending: Obstetrics & Gynecology | Admitting: Obstetrics & Gynecology

## 2015-11-14 DIAGNOSIS — N912 Amenorrhea, unspecified: Secondary | ICD-10-CM | POA: Insufficient documentation

## 2015-11-14 HISTORY — DX: Headache: R51

## 2015-11-14 HISTORY — DX: Headache, unspecified: R51.9

## 2015-11-14 LAB — URINE MICROSCOPIC-ADD ON: WBC, UA: NONE SEEN WBC/hpf (ref 0–5)

## 2015-11-14 LAB — URINALYSIS, ROUTINE W REFLEX MICROSCOPIC
Bilirubin Urine: NEGATIVE
GLUCOSE, UA: NEGATIVE mg/dL
Ketones, ur: NEGATIVE mg/dL
Leukocytes, UA: NEGATIVE
Nitrite: NEGATIVE
PROTEIN: NEGATIVE mg/dL
Specific Gravity, Urine: 1.02 (ref 1.005–1.030)
pH: 6.5 (ref 5.0–8.0)

## 2015-11-14 LAB — POCT PREGNANCY, URINE: Preg Test, Ur: NEGATIVE

## 2015-11-14 NOTE — Discharge Instructions (Signed)
Secondary Amenorrhea Secondary amenorrhea is the stopping of menstrual flow for 3-6 months in a female who has previously had periods. There are many possible causes. Most of these causes are not serious. Usually, treating the underlying problem causing the loss of menses will return your periods to normal. CAUSES  Some common and uncommon causes of not menstruating include:  Malnutrition.  Low blood sugar (hypoglycemia).  Polycystic ovary disease.  Stress or fear.  Breastfeeding.  Hormone imbalance.  Ovarian failure.  Medicines.  Extreme obesity.  Cystic fibrosis.  Low body weight or drastic weight reduction from any cause.  Early menopause.  Removal of ovaries or uterus.  Contraceptives.  Illness.  Long-term (chronic) illnesses.  Cushing syndrome.  Thyroid problems.  Birth control pills, patches, or vaginal rings for birth control. RISK FACTORS You may be at greater risk of secondary amenorrhea if:  You have a family history of this condition.  You have an eating disorder.  You do athletic training. DIAGNOSIS  A diagnosis is made by your health care provider taking a medical history and doing a physical exam. This will include a pelvic exam to check for problems with your reproductive organs. Pregnancy must be ruled out. Often, numerous blood tests are done to measure different hormones in the body. Urine testing may be done. Specialized exams (ultrasound, CT scan, MRI, or hysteroscopy) may have to be done as well as measuring the body mass index (BMI). TREATMENT  Treatment depends on the cause of the amenorrhea. If an eating disorder is present, this can be treated with an adequate diet and therapy. Chronic illnesses may improve with treatment of the illness. Amenorrhea may be corrected with medicines, lifestyle changes, or surgery. If the amenorrhea cannot be corrected, it is sometimes possible to create a false menstruation with medicines. HOME CARE  INSTRUCTIONS  Maintain a healthy diet.  Manage weight problems.  Exercise regularly but not excessively.  Get adequate sleep.  Manage stress.  Be aware of changes in your menstrual cycle. Keep a record of when your periods occur. Note the date your period starts, how long it lasts, and any problems. SEEK MEDICAL CARE IF: Your symptoms do not get better with treatment.   This information is not intended to replace advice given to you by your health care provider. Make sure you discuss any questions you have with your health care provider.   Document Released: 04/06/2006 Document Revised: 03/16/2014 Document Reviewed: 08/11/2012 Elsevier Interactive Patient Education 2016 Elsevier Inc.  

## 2015-11-14 NOTE — MAU Provider Note (Signed)
History     CSN: [id number]  Arrival date and time: 11/14/15 1800   First Provider Initiated Contact with Patient 11/14/15 2046      Chief Complaint  Patient presents with  . Possible Pregnancy   Kaitlin Gaussoshia H Perdue is 40 y.o. [id number] who presents today with missed period and breast tenderness. She states that her LMP was 10/08/15. She denies any pain at this time. She had some pain last week, but it has resolved. She states that her breasts are tender. She reports daily caffeine intake.    Possible Pregnancy  This is a new problem. The current episode started in the past 7 days. The problem has been unchanged. Pertinent negatives include no abdominal pain, chills, fever, nausea or vomiting. Nothing aggravates the symptoms. She has tried nothing for the symptoms.     Past Medical History:  Diagnosis Date  . Headache    only centered around her cycle  . No pertinent past medical history     Past Surgical History:  Procedure Laterality Date  . CESAREAN SECTION    . KNEE ARTHROSCOPY W/ ACL RECONSTRUCTION  1991 and 1995   Both done at separate times  . TUBAL LIGATION      Family History  Problem Relation Age of Onset  . Other Neg Hx     Social History  Substance Use Topics  . Smoking status: Never Smoker  . Smokeless tobacco: Never Used  . Alcohol use No    Allergies:  Allergies  Allergen Reactions  . Penicillins Hives, Shortness Of Breath, Swelling and Other (See Comments)    Has patient had a PCN reaction causing immediate rash, facial/tongue/throat swelling, SOB or lightheadedness with hypotension: Yes Has patient had a PCN reaction causing severe rash involving mucus membranes or skin necrosis: No Has patient had a PCN reaction that required hospitalization No Has patient had a PCN reaction occurring within the last 10 years: No If all of the above answers are "NO", then may proceed with Cephalosporin use.    No prescriptions prior to admission.    Review of  Systems  Constitutional: Negative for chills and fever.  Gastrointestinal: Negative for abdominal pain, nausea and vomiting.  Genitourinary: Negative for dysuria, frequency and urgency.  Endo/Heme/Allergies:       Breast tenderness    Physical Exam   Blood pressure 131/71, pulse 68, temperature 98.5 F (36.9 C), temperature source Oral, resp. rate 18, height 5\' 4"  (1.626 m), weight 232 lb 9.6 oz (105.5 kg), last menstrual period 10/09/2015.  Physical Exam  Nursing note and vitals reviewed. Constitutional: She is oriented to person, place, and time. She appears well-developed and well-nourished. No distress.  HENT:  Head: Normocephalic.  Cardiovascular: Normal rate.   Respiratory: Effort normal.  Musculoskeletal: Normal range of motion.  Neurological: She is alert and oriented to person, place, and time.  Psychiatric: She has a normal mood and affect.   Results for orders placed or performed during the hospital encounter of 11/14/15 (from the past 24 hour(s))  Urinalysis, Routine w reflex microscopic ([facility])     Status: Abnormal   Collection Time: 11/14/15  6:43 PM  Result Value Ref Range   Color, Urine YELLOW YELLOW   APPearance CLEAR CLEAR   Specific Gravity, Urine 1.020 1.005 - 1.030   pH 6.5 5.0 - 8.0   Glucose, UA NEGATIVE NEGATIVE mg/dL   Hgb urine dipstick TRACE (A) NEGATIVE   Bilirubin Urine NEGATIVE NEGATIVE   Ketones, ur NEGATIVE  NEGATIVE mg/dL   Protein, ur NEGATIVE NEGATIVE mg/dL   Nitrite NEGATIVE NEGATIVE   Leukocytes, UA NEGATIVE NEGATIVE  Urine microscopic-add on     Status: Abnormal   Collection Time: 11/14/15  6:43 PM  Result Value Ref Range   Squamous Epithelial / LPF 0-5 (A) NONE SEEN   WBC, UA NONE SEEN 0 - 5 WBC/hpf   RBC / HPF 0-5 0 - 5 RBC/hpf   Bacteria, UA RARE (A) NONE SEEN  Pregnancy, urine POC     Status: None   Collection Time: 11/14/15  6:56 PM  Result Value Ref Range   Preg Test, Ur NEGATIVE NEGATIVE     MAU Course   Procedures  MDM   Assessment and Plan   1. Amenorrhea    DC home Comfort measures reviewed  Menstrual calendar  RX: none  Return to MAU as needed   Follow-up Information    [facility] .   Specialty:  Obstetrics and Gynecology Contact information: [address] [place] [address] [phone]           Tawnya Crook 11/14/2015, 8:48 PM

## 2015-11-14 NOTE — MAU Note (Signed)
Sore breasts since the week before her august cycle.  Also had bad right lower abd pain last Thursday through Monday, no pain since then.  Denies needing any STD testing.

## 2015-11-14 NOTE — MAU Note (Signed)
Pt reports she has not had aperiod since 10/09/15 . C/O sore breast reports some abd cramping  On and off. Has felt achy and tired.

## 2015-12-03 ENCOUNTER — Encounter (INDEPENDENT_AMBULATORY_CARE_PROVIDER_SITE_OTHER): Payer: Self-pay

## 2016-01-07 ENCOUNTER — Encounter: Payer: Self-pay | Admitting: Obstetrics & Gynecology

## 2016-02-19 ENCOUNTER — Ambulatory Visit (INDEPENDENT_AMBULATORY_CARE_PROVIDER_SITE_OTHER): Payer: Self-pay | Admitting: Obstetrics & Gynecology

## 2016-02-19 ENCOUNTER — Encounter: Payer: Self-pay | Admitting: Obstetrics & Gynecology

## 2016-02-19 VITALS — BP 128/90 | HR 78 | Wt 234.5 lb

## 2016-02-19 DIAGNOSIS — N939 Abnormal uterine and vaginal bleeding, unspecified: Secondary | ICD-10-CM

## 2016-02-19 DIAGNOSIS — R102 Pelvic and perineal pain: Secondary | ICD-10-CM

## 2016-02-19 NOTE — Progress Notes (Signed)
History:  40 y.o. [id number] here today for eval of one irreg cycle. {Pt reports that her cycle was 10 day slate. She reports that her cycle is never late so she was worried. She had a pain in the right side at the time.  She reports that tht pain has persisted for 3 weeks.  Pt is currently sexually active. Was married for 22 years and her partner passed away  She has been sexually active for 1 year.  Pt is s/p a BTL 14 years prev. Pt describes the pain as dull and achy.  Pain seems to be low on the right side.  Last PAP- 2005.   The following portions of the patient's history were reviewed and updated as appropriate: allergies, current medications, past family history, past medical history, past social history, past surgical history and problem list.  Review of Systems:  Pertinent items are noted in HPI.   Objective:  Physical Exam Blood pressure 128/90, pulse 78, weight 234 lb 8 oz (106.4 kg), last menstrual period 01/31/2016. Gen: NAD Lungs: CTA CV: RRR Abd: Soft, nontender and nondistended Pelvic: Normal appearing external genitalia; normal appearing vaginal mucosa and cervix.  Normal discharge.  Small uterus, no other palpable masses, no uterine or adnexal tenderness  Labs and Imaging No results found.  Assessment & Plan:  Pelvic pain on right side  Pelvic US Cervical cx Mammogram and PAP scholarship F/u in 3 months or sooner prn  Agron Swiney L. Harraway-Smith, M.D., Evern CoreFACOG

## 2016-02-19 NOTE — Progress Notes (Signed)
Pt given free pap smear #.  [facility]  US scheduled for December 23rd  @ 1500.  Pt notified.

## 2016-02-20 LAB — GC/CHLAMYDIA PROBE AMP (~~LOC~~) NOT AT ARMC
Chlamydia: NEGATIVE
NEISSERIA GONORRHEA: NEGATIVE

## 2016-02-24 ENCOUNTER — Encounter: Payer: Self-pay | Admitting: Obstetrics & Gynecology

## 2016-02-27 ENCOUNTER — Ambulatory Visit (HOSPITAL_COMMUNITY)
Admission: RE | Admit: 2016-02-27 | Discharge: 2016-02-27 | Disposition: A | Discharge status: Home / Self Care | Payer: Self-pay | Source: Ambulatory Visit | Attending: Obstetrics & Gynecology | Admitting: Obstetrics & Gynecology

## 2016-02-27 DIAGNOSIS — R938 Abnormal findings on diagnostic imaging of other specified body structures: Secondary | ICD-10-CM | POA: Insufficient documentation

## 2016-02-27 DIAGNOSIS — R102 Pelvic and perineal pain: Secondary | ICD-10-CM | POA: Insufficient documentation

## 2016-03-11 ENCOUNTER — Other Ambulatory Visit: Payer: Self-pay | Admitting: Obstetrics & Gynecology

## 2016-03-11 DIAGNOSIS — Z1231 Encounter for screening mammogram for malignant neoplasm of breast: Secondary | ICD-10-CM

## 2016-03-23 ENCOUNTER — Ambulatory Visit
Admission: RE | Admit: 2016-03-23 | Discharge: 2016-03-23 | Disposition: A | Discharge status: Home / Self Care | Payer: No Typology Code available for payment source | Source: Ambulatory Visit | Attending: Obstetrics & Gynecology | Admitting: Obstetrics & Gynecology

## 2016-03-23 DIAGNOSIS — Z1231 Encounter for screening mammogram for malignant neoplasm of breast: Secondary | ICD-10-CM

## 2017-03-16 DIAGNOSIS — E66813 Obesity, class 3: Secondary | ICD-10-CM | POA: Insufficient documentation

## 2017-03-16 DIAGNOSIS — M2550 Pain in unspecified joint: Secondary | ICD-10-CM | POA: Insufficient documentation

## 2017-03-16 DIAGNOSIS — Z6841 Body Mass Index (BMI) 40.0 and over, adult: Secondary | ICD-10-CM | POA: Insufficient documentation

## 2017-05-11 ENCOUNTER — Ambulatory Visit
Admission: RE | Admit: 2017-05-11 | Discharge: 2017-05-11 | Disposition: A | Discharge status: Home / Self Care | Payer: No Typology Code available for payment source | Source: Ambulatory Visit | Attending: Physician Assistant | Admitting: Physician Assistant

## 2017-05-11 ENCOUNTER — Other Ambulatory Visit: Payer: Self-pay | Admitting: Physician Assistant

## 2017-05-11 DIAGNOSIS — M25551 Pain in right hip: Secondary | ICD-10-CM

## 2017-05-11 DIAGNOSIS — M545 Low back pain, unspecified: Secondary | ICD-10-CM

## 2018-05-19 ENCOUNTER — Other Ambulatory Visit: Payer: Self-pay | Admitting: Family Medicine

## 2018-05-19 DIAGNOSIS — Z1231 Encounter for screening mammogram for malignant neoplasm of breast: Secondary | ICD-10-CM

## 2018-05-26 ENCOUNTER — Inpatient Hospital Stay: Admission: RE | Admit: 2018-05-26 | Payer: No Typology Code available for payment source | Source: Ambulatory Visit

## 2018-07-21 ENCOUNTER — Ambulatory Visit (INDEPENDENT_AMBULATORY_CARE_PROVIDER_SITE_OTHER): Payer: 59 | Admitting: Psychiatry

## 2018-07-21 ENCOUNTER — Encounter (HOSPITAL_COMMUNITY): Payer: Self-pay | Admitting: Psychiatry

## 2018-07-21 VITALS — Ht 64.0 in | Wt 250.0 lb

## 2018-07-21 DIAGNOSIS — F411 Generalized anxiety disorder: Secondary | ICD-10-CM

## 2018-07-21 DIAGNOSIS — F4321 Adjustment disorder with depressed mood: Secondary | ICD-10-CM

## 2018-07-21 DIAGNOSIS — F332 Major depressive disorder, recurrent severe without psychotic features: Secondary | ICD-10-CM

## 2018-07-21 MED ORDER — FLUOXETINE HCL 10 MG PO CAPS: 10.0000 mg | ORAL_CAPSULE | Freq: Two times a day (BID) | ORAL | 0 refills | Status: DC

## 2018-07-21 NOTE — Progress Notes (Signed)
Psychiatric Initial Adult Assessment   Patient Identification: Kaitlin Cole MRN:  [id number] Date of Evaluation:  07/21/2018 Referral Source: primary care Chief Complaint:   Visit Diagnosis:    ICD-10-CM   1. Severe episode of recurrent major depressive disorder, without psychotic features (HCC) F33.2   2. GAD (generalized anxiety disorder) F41.1   3. Grief F43.21    I connected with AHJAH RAUDENBUSH on 07/21/18 at 11:00 AM EDT by a video enabled telemedicine application and verified that I am speaking with the correct person using two identifiers.   I discussed the limitations of evaluation and management by telemedicine and the availability of in person appointments. The patient expressed understanding and agreed to proceed.   History of Present Illness: Patient is a 43 years old currently married Caucasian female referred by primary care physician and also by her husband psychiatrist for management of depression. She works as  A Lawyer at [employer] Patient has experienced depression since her husband's death in 07/10/14 they were together for 22 years and have 3 kids.  Patient had a difficult time after that because of not having a job with home schooling the case and she still feels down depressed thinking about losing her husband she went through grief but never got help or treatment  She is currently married with her other  husband for the last 2 years.  He suffers from PTSD it was difficult dealing with him for the first 1 year but now it is getting better.  She endorsed feeling down decreased energy withdrawn at times feeling like dwelling on the past not interested in things hopelessness and crying spells but no suicidal thoughts.  She is not on any meds.  Takes meds prn for arthritis  Also please, excessive she worries about her health about her kids about the relationship.  She said it is a difficult time because she still going through the grief and can cry easily Psychotic  symptoms denies manic symptoms currently or in the past Denies use of alcohol she has used on color past that she would get more emotional and down  No past psychiatric admissions no past psychiatric treatment except for at age 67 she has been on Prozac for depression  States she is not sure if the medication she took for long or the depression went away. She also snores and also wakes up tired she feels fatigued and has gained weight  There is no associated drug use as of now  She denies past history of abuse  Modifying factors her kids.  Her current relationship her job can be stressful but she is at least getting some money Aggravating factors; death of  her first husband in 10-Jul-2014  Severity : 4/10 . 10 being no depression Duration since 2014/07/10   Past Psychiatric History: depression  Previous Psychotropic Medications: Yes   Substance Abuse History in the last 12 months:  No.  Consequences of Substance Abuse: NA  Past Medical History:  Past Medical History:  Diagnosis Date  . Headache    only centered around her cycle  . No pertinent past medical history     Past Surgical History:  Procedure Laterality Date  . CESAREAN SECTION    . KNEE ARTHROSCOPY W/ ACL RECONSTRUCTION  1991 and 1995   Both done at separate times  . TUBAL LIGATION      Family Psychiatric History: denies  Family History:  Family History  Problem Relation Age of Onset  . Other  Neg Hx     Social History:   Social History   Socioeconomic History  . Marital status: Married    Spouse name: Not on file  . Number of children: Not on file  . Years of education: Not on file  . Highest education level: Not on file  Occupational History  . Not on file  Social Needs  . Financial resource strain: Not on file  . Food insecurity:    Worry: Not on file    Inability: Not on file  . Transportation needs:    Medical: Not on file    Non-medical: Not on file  Tobacco Use  . Smoking status: Never Smoker   . Smokeless tobacco: Never Used  Substance and Sexual Activity  . Alcohol use: No  . Drug use: No  . Sexual activity: Yes    Birth control/protection: None    Comment: last sex Sep 6th 2017  Lifestyle  . Physical activity:    Days per week: Not on file    Minutes per session: Not on file  . Stress: Not on file  Relationships  . Social connections:    Talks on phone: Not on file    Gets together: Not on file    Attends religious service: Not on file    Active member of club or organization: Not on file    Attends meetings of clubs or organizations: Not on file    Relationship status: Not on file  Other Topics Concern  . Not on file  Social History Narrative  . Not on file    Additional Social History: Grew up with her parents and brother no abuse.  Her first marriage lasted for 23 years that he died that has led to her grief She has 3 kids  Allergies:   Allergies  Allergen Reactions  . Penicillins Hives, Shortness Of Breath, Swelling and Other (See Comments)    Has patient had a PCN reaction causing immediate rash, facial/tongue/throat swelling, SOB or lightheadedness with hypotension: Yes Has patient had a PCN reaction causing severe rash involving mucus membranes or skin necrosis: No Has patient had a PCN reaction that required hospitalization No Has patient had a PCN reaction occurring within the last 10 years: No If all of the above answers are "NO", then may proceed with Cephalosporin use.    Metabolic Disorder Labs: No results found for: HGBA1C, MPG No results found for: PROLACTIN No results found for: CHOL, TRIG, HDL, CHOLHDL, VLDL, LDLCALC No results found for: TSH  Therapeutic Level Labs: No results found for: LITHIUM No results found for: CBMZ No results found for: VALPROATE  Current Medications: Current Outpatient Medications  Medication Sig Dispense Refill  . medroxyPROGESTERone (PROVERA) 10 MG tablet TK 1 T PO QD FOR 10 DAYS    . FLUoxetine  (PROZAC) 10 MG capsule Take 1 capsule (10 mg total) by mouth 2 (two) times daily. 60 capsule 0   No current facility-administered medications for this visit.      Psychiatric Specialty Exam: Review of Systems  Cardiovascular: Negative for chest pain.  Skin: Positive for rash.  Psychiatric/Behavioral: Positive for depression. Negative for substance abuse and suicidal ideas. The patient is nervous/anxious.     Height 5\' 4"  (1.626 m), weight 250 lb (113.4 kg).Body mass index is 42.91 kg/m.  General Appearance: Casual  Eye Contact:  Fair  Speech:  Normal Rate  Volume:  Decreased  Mood:  Depressed  Affect:  Congruent  Thought Process:  Goal Directed  Orientation:  Full (Time, Place, and Person)  Thought Content:  Logical  Suicidal Thoughts:  No  Homicidal Thoughts:  No  Memory:  Immediate;   Fair Recent;   Fair  Judgement:  Fair  Insight:  Fair  Psychomotor Activity:  Normal  Concentration:  Concentration: Fair and Attention Span: Fair  Recall:  [employer] of Knowledge:Fair  Language: Fair  Akathisia:  No  Handed:  Right  AIMS (if indicated):  not done  Assets:  Desire for Improvement Social Support  ADL's:  Intact  Cognition: WNL  Sleep:  Fair   Screenings:   Assessment and Plan: as follows 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 The patient was provided an opportunity to ask questions and all were answered. The patient agreed with the plan and demonstrated an understanding of the instructions.   The patient was advised to call back or seek an in-person evaluation if the symptoms worsen or if the condition fails to improve as anticipated.  I provided 50 minutes of non-face-to-face time during this encounter.   Thresa Ross, MD 5/14/202011:35 AM

## 2018-08-16 ENCOUNTER — Encounter (HOSPITAL_COMMUNITY): Payer: Self-pay | Admitting: Psychiatry

## 2018-08-16 ENCOUNTER — Ambulatory Visit (INDEPENDENT_AMBULATORY_CARE_PROVIDER_SITE_OTHER): Payer: 59 | Admitting: Psychiatry

## 2018-08-16 DIAGNOSIS — F332 Major depressive disorder, recurrent severe without psychotic features: Secondary | ICD-10-CM | POA: Diagnosis not present

## 2018-08-16 DIAGNOSIS — F411 Generalized anxiety disorder: Secondary | ICD-10-CM

## 2018-08-16 DIAGNOSIS — F4321 Adjustment disorder with depressed mood: Secondary | ICD-10-CM | POA: Diagnosis not present

## 2018-08-16 MED ORDER — FLUOXETINE HCL 20 MG PO CAPS: 20.0000 mg | ORAL_CAPSULE | Freq: Every day | ORAL | 1 refills | Status: DC

## 2018-08-16 NOTE — Progress Notes (Signed)
[facility] Outpatient Follow up    Patient Identification: Kaitlin Cole MRN:  [id number] Date of Evaluation:  08/16/2018 Referral Source: primary care Chief Complaint:  depression follow up Visit Diagnosis:    ICD-10-CM   1. Severe episode of recurrent major depressive disorder, without psychotic features ([place]) F33.2   2. GAD (generalized anxiety disorder) F41.1   3. Grief F43.21    I connected with Kaitlin Cole on 08/16/18 at  2:00 PM EDT by telephone and verified that I am speaking with the correct person using two identifiers.   I discussed the limitations, risks, security and privacy concerns of performing an evaluation and management service by telephone and the availability of in person appointments. I also discussed with the patient that there may be a patient responsible charge related to this service. The patient expressed understanding and agreed to proceed.  History of Present Illness: Patient is a 43 years old currently married Caucasian female initially referred by primary care physician and also by her husband psychiatrist for management of depression. 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  He suffers from PTSD it was difficult dealing with him for the first 1 year but now it is getting better.  Last viist restarted prozac and now at 20mg . Doing better. She feels her normal self. Less depressed and feels productive and tolerating stress.   Denies use of alcohol she has used in past that she would get more emotional and down    She denies past history of abuse  Modifying factors her kids.  Her current relationship her job can be stressful but she is at least getting some money Aggravating factors; death of  her first husband in Jun 28, 2014  Severity : improved Duration since 2014-06-28   Past Psychiatric  History: depression    Past Medical History:  Past Medical History:  Diagnosis Date  . Headache    only centered around her cycle  . No pertinent past medical history     Past Surgical History:  Procedure Laterality Date  . CESAREAN SECTION    . KNEE ARTHROSCOPY W/ ACL RECONSTRUCTION  1991 and 1995   Both done at separate times  . TUBAL LIGATION      Family Psychiatric History: denies  Family History:  Family History  Problem Relation Age of Onset  . Other Neg Hx     Social History:   Social History   Socioeconomic History  . Marital status: Married    Spouse name: Not on file  . Number of children: Not on file  . Years of education: Not on file  . Highest education level: Not on file  Occupational History  . Not on file  Social Needs  . Financial resource strain: Not on file  . Food insecurity:    Worry: Not on file    Inability: Not on file  . Transportation needs:    Medical: Not on file    Non-medical: Not on file  Tobacco Use  . Smoking status: Never Smoker  . Smokeless tobacco: Never Used  Substance and Sexual Activity  . Alcohol use: No  . Drug use: No  . Sexual activity: Yes    Birth control/protection: None    Comment: last sex Sep 6th 2017  Lifestyle  . Physical activity:  Days per week: Not on file    Minutes per session: Not on file  . Stress: Not on file  Relationships  . Social connections:    Talks on phone: Not on file    Gets together: Not on file    Attends religious service: Not on file    Active member of club or organization: Not on file    Attends meetings of clubs or organizations: Not on file    Relationship status: Not on file  Other Topics Concern  . Not on file  Social History Narrative  . Not on file      Allergies:   Allergies  Allergen Reactions  . Penicillins Hives, Shortness Of Breath, Swelling and Other (See Comments)    Has patient had a PCN reaction causing immediate rash, facial/tongue/throat  swelling, SOB or lightheadedness with hypotension: Yes Has patient had a PCN reaction causing severe rash involving mucus membranes or skin necrosis: No Has patient had a PCN reaction that required hospitalization No Has patient had a PCN reaction occurring within the last 10 years: No If all of the above answers are "NO", then may proceed with Cephalosporin use.    Metabolic Disorder Labs: No results found for: HGBA1C, MPG No results found for: PROLACTIN No results found for: CHOL, TRIG, HDL, CHOLHDL, VLDL, LDLCALC No results found for: TSH  Therapeutic Level Labs: No results found for: LITHIUM No results found for: CBMZ No results found for: VALPROATE  Current Medications: Current Outpatient Medications  Medication Sig Dispense Refill  . FLUoxetine (PROZAC) 20 MG capsule Take 1 capsule (20 mg total) by mouth daily. 30 capsule 1  . medroxyPROGESTERone (PROVERA) 10 MG tablet TK 1 T PO QD FOR 10 DAYS     No current facility-administered medications for this visit.      Psychiatric Specialty Exam: Review of Systems  Cardiovascular: Negative for chest pain.  Skin: Negative for rash.  Psychiatric/Behavioral: Negative for depression, substance abuse and suicidal ideas.    There were no vitals taken for this visit.There is no height or weight on file to calculate BMI.  General Appearance:   Eye Contact:    Speech:  Normal Rate  Volume:  Decreased  Mood:  better  Affect:  Congruent  Thought Process:  Goal Directed  Orientation:  Full (Time, Place, and Person)  Thought Content:  Logical  Suicidal Thoughts:  No  Homicidal Thoughts:  No  Memory:  Immediate;   Fair Recent;   Fair  Judgement:  Fair  Insight:  Fair  Psychomotor Activity:  Normal  Concentration:  Concentration: Fair and Attention Span: Fair  Recall:  [employer]Fair  Fund of Knowledge:Fair  Language: Fair  Akathisia:  No  Handed:  Right  AIMS (if indicated):  not done  Assets:  Desire for Improvement Social Support   ADL's:  Intact  Cognition: WNL  Sleep:  Fair   Screenings:   Assessment and Plan: as follows MDD moderate to severe:improved. Continue prozac 20mg  GAD better continue prozac  Grief : improved Provided supportive therapy I discussed the assessment and treatment plan with the patient. The patient was provided an opportunity to ask questions and all were answered. The patient agreed with the plan and demonstrated an understanding of the instructions.   The patient was advised to call back or seek an in-person evaluation if the symptoms worsen or if the condition fails to improve as anticipated.  I provided 15 minutes of non-face-to-face time during this encounter.   Cleone Hulick  Gilmore LarocheAkhtar, MD 6/9/20202:06 PM

## 2018-09-22 DIAGNOSIS — M79672 Pain in left foot: Secondary | ICD-10-CM | POA: Insufficient documentation

## 2018-09-22 DIAGNOSIS — M722 Plantar fascial fibromatosis: Secondary | ICD-10-CM | POA: Insufficient documentation

## 2018-09-23 ENCOUNTER — Ambulatory Visit
Admission: RE | Admit: 2018-09-23 | Discharge: 2018-09-23 | Disposition: A | Discharge status: Home / Self Care | Payer: 59 | Source: Ambulatory Visit | Attending: Family Medicine | Admitting: Family Medicine

## 2018-09-23 ENCOUNTER — Other Ambulatory Visit: Payer: Self-pay | Admitting: Family Medicine

## 2018-09-23 ENCOUNTER — Other Ambulatory Visit: Payer: Self-pay

## 2018-09-23 DIAGNOSIS — M79672 Pain in left foot: Secondary | ICD-10-CM

## 2018-10-18 ENCOUNTER — Ambulatory Visit (HOSPITAL_COMMUNITY): Payer: 59 | Admitting: Psychiatry

## 2018-10-18 ENCOUNTER — Other Ambulatory Visit: Payer: Self-pay

## 2018-10-19 ENCOUNTER — Other Ambulatory Visit (HOSPITAL_COMMUNITY): Payer: Self-pay

## 2018-10-19 MED ORDER — FLUOXETINE HCL 20 MG PO CAPS: 20.0000 mg | ORAL_CAPSULE | Freq: Every day | ORAL | 0 refills | Status: DC

## 2018-12-02 ENCOUNTER — Telehealth (HOSPITAL_COMMUNITY): Payer: Self-pay

## 2018-12-02 NOTE — Telephone Encounter (Signed)
Received a refill request from pharmacy for patient's Fluoxetine 20mg . The patient's last completed appointment was on 08/16/18. She no-showed her 10/18/18 appointment and there are no follow-up notes regarding a future appointment. Would you like me to refill her Fluoxetine 20mg  or does she need to make a follow-up appointment before refilling medication? Please review and advise. Thank you.

## 2018-12-02 NOTE — Telephone Encounter (Signed)
Make fu and one refill. No refills after that.

## 2018-12-05 ENCOUNTER — Other Ambulatory Visit (HOSPITAL_COMMUNITY): Payer: Self-pay

## 2018-12-05 MED ORDER — FLUOXETINE HCL 20 MG PO CAPS: 20.0000 mg | ORAL_CAPSULE | Freq: Every day | ORAL | 0 refills | Status: DC

## 2018-12-05 NOTE — Telephone Encounter (Signed)
Done

## 2018-12-12 ENCOUNTER — Ambulatory Visit (INDEPENDENT_AMBULATORY_CARE_PROVIDER_SITE_OTHER): Payer: 59 | Admitting: Psychiatry

## 2018-12-12 ENCOUNTER — Encounter (HOSPITAL_COMMUNITY): Payer: Self-pay | Admitting: Psychiatry

## 2018-12-12 DIAGNOSIS — F4321 Adjustment disorder with depressed mood: Secondary | ICD-10-CM

## 2018-12-12 DIAGNOSIS — F411 Generalized anxiety disorder: Secondary | ICD-10-CM

## 2018-12-12 DIAGNOSIS — F332 Major depressive disorder, recurrent severe without psychotic features: Secondary | ICD-10-CM | POA: Diagnosis not present

## 2018-12-12 MED ORDER — FLUOXETINE HCL 20 MG PO CAPS: 20.0000 mg | ORAL_CAPSULE | Freq: Every day | ORAL | 2 refills | Status: DC

## 2018-12-12 NOTE — Progress Notes (Signed)
[facility] Outpatient Follow up    Patient Identification: Kaitlin Cole MRN:  [id number] Date of Evaluation:  12/12/2018 Referral Source: primary care Chief Complaint:  depression follow up Visit Diagnosis:    ICD-10-CM   1. Severe episode of recurrent major depressive disorder, without psychotic features ([place])  F33.2   2. GAD (generalized anxiety disorder)  F41.1   3. Grief  F43.21     I connected with SHERLEEN PANGBORN on 12/12/18 at  3:00 PM EDT by telephone and verified that I am speaking with the correct person using two identifiers.  I discussed the limitations, risks, security and privacy concerns of performing an evaluation and management service by telephone and the availability of in person appointments. I also discussed with the patient that there may be a patient responsible charge related to this service. The patient expressed understanding and agreed to proceed.  History of Present Illness: Patient is a 43 years old currently married Caucasian female initially referred by primary care physician and also by her husband psychiatrist for management of depression. She works as  A Quarry manager at [employer] Patient has experienced depression since her husband's death in 2014-06-26 they were together for 22 years  She is currently married with her other  husband for the last 2 years.  He suffers from PTSD it was difficult dealing with him for the first 1 year but now it is getting better.  Doing fair on prozac   Denies use of alcohol she has used in past that she would get more emotional and down   She denies past history of abuse  Modifying factors: her kids.  Her current relationship her job can be stressful but not worse Aggravating factors; death of  her first husband in 06-26-2014  Severity : improved Duration since Jun 26, 2014   Past Psychiatric History: depression    Past Medical History:  Past Medical History:  Diagnosis Date  . Headache    only centered around her cycle  . No  pertinent past medical history     Past Surgical History:  Procedure Laterality Date  . CESAREAN SECTION    . KNEE ARTHROSCOPY W/ ACL RECONSTRUCTION  1991 and 1995   Both done at separate times  . TUBAL LIGATION      Family Psychiatric History: denies  Family History:  Family History  Problem Relation Age of Onset  . Other Neg Hx     Social History:   Social History   Socioeconomic History  . Marital status: Married    Spouse name: Not on file  . Number of children: Not on file  . Years of education: Not on file  . Highest education level: Not on file  Occupational History  . Not on file  Social Needs  . Financial resource strain: Not on file  . Food insecurity    Worry: Not on file    Inability: Not on file  . Transportation needs    Medical: Not on file    Non-medical: Not on file  Tobacco Use  . Smoking status: Never Smoker  . Smokeless tobacco: Never Used  Substance and Sexual Activity  . Alcohol use: No  . Drug use: No  . Sexual activity: Yes    Birth control/protection: None    Comment: last sex Sep 6th 2017  Lifestyle  . Physical activity    Days per week: Not on file    Minutes per session: Not on file  . Stress: Not on file  Relationships  . Social Musician on phone: Not on file    Gets together: Not on file    Attends religious service: Not on file    Active member of club or organization: Not on file    Attends meetings of clubs or organizations: Not on file    Relationship status: Not on file  Other Topics Concern  . Not on file  Social History Narrative  . Not on file      Allergies:   Allergies  Allergen Reactions  . Penicillins Hives, Shortness Of Breath, Swelling and Other (See Comments)    Has patient had a PCN reaction causing immediate rash, facial/tongue/throat swelling, SOB or lightheadedness with hypotension: Yes Has patient had a PCN reaction causing severe rash involving mucus membranes or skin necrosis:  No Has patient had a PCN reaction that required hospitalization No Has patient had a PCN reaction occurring within the last 10 years: No If all of the above answers are "NO", then may proceed with Cephalosporin use.    Metabolic Disorder Labs: No results found for: HGBA1C, MPG No results found for: PROLACTIN No results found for: CHOL, TRIG, HDL, CHOLHDL, VLDL, LDLCALC No results found for: TSH  Therapeutic Level Labs: No results found for: LITHIUM No results found for: CBMZ No results found for: VALPROATE  Current Medications: Current Outpatient Medications  Medication Sig Dispense Refill  . FLUoxetine (PROZAC) 20 MG capsule Take 1 capsule (20 mg total) by mouth daily. 30 capsule 2  . medroxyPROGESTERone (PROVERA) 10 MG tablet TK 1 T PO QD FOR 10 DAYS     No current facility-administered medications for this visit.      Psychiatric Specialty Exam: Review of Systems  Cardiovascular: Negative for chest pain.  Skin: Negative for rash.  Psychiatric/Behavioral: Negative for depression, substance abuse and suicidal ideas.    There were no vitals taken for this visit.There is no height or weight on file to calculate BMI.  General Appearance:   Eye Contact:    Speech:  Normal Rate  Volume:  Decreased  Mood:  fair  Affect:  Congruent  Thought Process:  Goal Directed  Orientation:  Full (Time, Place, and Person)  Thought Content:  Logical  Suicidal Thoughts:  No  Homicidal Thoughts:  No  Memory:  Immediate;   Fair Recent;   Fair  Judgement:  Fair  Insight:  Fair  Psychomotor Activity:  Normal  Concentration:  Concentration: Fair and Attention Span: Fair  Recall:  [employer] of Knowledge:Fair  Language: Fair  Akathisia:  No  Handed:  Right  AIMS (if indicated):  not done  Assets:  Desire for Improvement Social Support  ADL's:  Intact  Cognition: WNL  Sleep:  Fair   Screenings:   Assessment and Plan: as follows MDD moderate to severe: fair, continue prozac GAD  better continue prozac  Grief :managing fair Provided supportive therapy I discussed the assessment and treatment plan with the patient. The patient was provided an opportunity to ask questions and all were answered. The patient agreed with the plan and demonstrated an understanding of the instructions.   The patient was advised to call back or seek an in-person evaluation if the symptoms worsen or if the condition fails to improve as anticipated.  I provided 15 minutes of non-face-to-face time during this encounter. Fu 26m.   Thresa Ross, MD 10/5/20203:06 PM

## 2019-03-20 ENCOUNTER — Ambulatory Visit (INDEPENDENT_AMBULATORY_CARE_PROVIDER_SITE_OTHER): Payer: 59 | Admitting: Psychiatry

## 2019-03-20 ENCOUNTER — Encounter (HOSPITAL_COMMUNITY): Payer: Self-pay | Admitting: Psychiatry

## 2019-03-20 DIAGNOSIS — F4321 Adjustment disorder with depressed mood: Secondary | ICD-10-CM | POA: Diagnosis not present

## 2019-03-20 DIAGNOSIS — F411 Generalized anxiety disorder: Secondary | ICD-10-CM | POA: Diagnosis not present

## 2019-03-20 DIAGNOSIS — F331 Major depressive disorder, recurrent, moderate: Secondary | ICD-10-CM

## 2019-03-20 NOTE — Progress Notes (Signed)
[facility] Outpatient Follow up    Patient Identification: Kaitlin Cole MRN:  [id number] Date of Evaluation:  03/20/2019 Referral Source: primary care Chief Complaint:  depression follow up Visit Diagnosis:    ICD-10-CM   1. GAD (generalized anxiety disorder)  F41.1      I connected with Kaitlin Cole on 03/20/19 at  4:00 PM EST by telephone and verified that I am speaking with the correct person using two identifiers.  I discussed the limitations, risks, security and privacy concerns of performing an evaluation and management service by telephone and the availability of in person appointments. I also discussed with the patient that there may be a patient responsible charge related to this service. The patient expressed understanding and agreed to proceed.  History of Present Illness: Patient is a 44  years old currently married Caucasian female initially referred by primary care physician and also by her husband psychiatrist for management of depression. 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  He suffers from PTSD it was difficult dealing with him for the first 1 year but now it is getting better.  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 Stress is low. She feels her depression and anxiety was related to her last job Have self stopped prozac one month ago and feels stable without. Does not want to be on med.  Understands the risk but feels it was the last job stress   Denies use of alcohol she has used in past She denies past history of abuse  Modifying factors: her kids.  Her current relationship  Aggravating factors; death of  her first husband in 06-20-14. Last job  Severity : improved Duration since 06-20-2014   Past Psychiatric History: depression    Past Medical  History:  Past Medical History:  Diagnosis Date  . Headache    only centered around her cycle  . No pertinent past medical history     Past Surgical History:  Procedure Laterality Date  . CESAREAN SECTION    . KNEE ARTHROSCOPY W/ ACL RECONSTRUCTION  1991 and 1995   Both done at separate times  . TUBAL LIGATION      Family Psychiatric History: denies  Family History:  Family History  Problem Relation Age of Onset  . Other Neg Hx     Social History:   Social History   Socioeconomic History  . Marital status: Married    Spouse name: Not on file  . Number of children: Not on file  . Years of education: Not on file  . Highest education level: Not on file  Occupational History  . Not on file  Tobacco Use  . Smoking status: Never Smoker  . Smokeless tobacco: Never Used  Substance and Sexual Activity  . Alcohol use: No  . Drug use: No  . Sexual activity: Yes    Birth control/protection: None    Comment: last sex Sep 6th 2017  Other Topics Concern  . Not on file  Social History Narrative  . Not on file   Social Determinants of Health   Financial Resource Strain:   . Difficulty of Paying Living Expenses: Not on file  Food Insecurity:   . Worried About Programme researcher, broadcasting/film/video in the  Last Year: Not on file  . Ran Out of Food in the Last Year: Not on file  Transportation Needs:   . Lack of Transportation (Medical): Not on file  . Lack of Transportation (Non-Medical): Not on file  Physical Activity:   . Days of Exercise per Week: Not on file  . Minutes of Exercise per Session: Not on file  Stress:   . Feeling of Stress : Not on file  Social Connections:   . Frequency of Communication with Friends and Family: Not on file  . Frequency of Social Gatherings with Friends and Family: Not on file  . Attends Religious Services: Not on file  . Active Member of Clubs or Organizations: Not on file  . Attends Archivist Meetings: Not on file  . Marital Status: Not on  file      Allergies:   Allergies  Allergen Reactions  . Penicillins Hives, Shortness Of Breath, Swelling and Other (See Comments)    Has patient had a PCN reaction causing immediate rash, facial/tongue/throat swelling, SOB or lightheadedness with hypotension: Yes Has patient had a PCN reaction causing severe rash involving mucus membranes or skin necrosis: No Has patient had a PCN reaction that required hospitalization No Has patient had a PCN reaction occurring within the last 10 years: No If all of the above answers are "NO", then may proceed with Cephalosporin use.    Metabolic Disorder Labs: No results found for: HGBA1C, MPG No results found for: PROLACTIN No results found for: CHOL, TRIG, HDL, CHOLHDL, VLDL, LDLCALC No results found for: TSH  Therapeutic Level Labs: No results found for: LITHIUM No results found for: CBMZ No results found for: VALPROATE  Current Medications: Current Outpatient Medications  Medication Sig Dispense Refill  . medroxyPROGESTERone (PROVERA) 10 MG tablet TK 1 T PO QD FOR 10 DAYS     No current facility-administered medications for this visit.     Psychiatric Specialty Exam: Review of Systems  Psychiatric/Behavioral: Negative for depression, substance abuse and suicidal ideas.    There were no vitals taken for this visit.There is no height or weight on file to calculate BMI.  General Appearance:   Eye Contact:    Speech:  Normal Rate  Volume:  Decreased  Mood: euthymic  Affect:  Congruent  Thought Process:  Goal Directed  Orientation:  Full (Time, Place, and Person)  Thought Content:  Logical  Suicidal Thoughts:  No  Homicidal Thoughts:  No  Memory:  Immediate;   Fair Recent;   Fair  Judgement:  Fair  Insight:  Fair  Psychomotor Activity:  Normal  Concentration:  Concentration: Fair and Attention Span: Fair  Recall:  [employer] of Knowledge:Fair  Language: Fair  Akathisia:  No  Handed:  Right  AIMS (if indicated):  not  done  Assets:  Desire for Improvement Social Support  ADL's:  Intact  Cognition: WNL  Sleep:  Fair   Screenings:   Assessment and Plan: as follows MDD moderate to severe: fair without med, she is not taking , understands the risk  GAD better . Not on meds Grief :managing fair Provided supportive therapy I discussed the assessment and treatment plan with the patient. The patient was provided an opportunity to ask questions and all were answered. The patient agreed with the plan and demonstrated an understanding of the instructions.   The patient was advised to call back or seek an in-person evaluation if the symptoms worsen or if the condition fails to improve  as anticipated.  I provided 15 minutes of non-face-to-face time during this encounter. Fu 65m.  She can call if she continues to do well she can cancel appointment or report earlier. She feels not want to be on meds and is stable for now  Thresa Ross, MD 1/11/20214:06 PM

## 2019-03-21 ENCOUNTER — Ambulatory Visit (HOSPITAL_COMMUNITY): Payer: 59 | Admitting: Psychiatry

## 2019-05-09 DIAGNOSIS — M25551 Pain in right hip: Secondary | ICD-10-CM | POA: Insufficient documentation

## 2019-06-19 ENCOUNTER — Ambulatory Visit (HOSPITAL_COMMUNITY): Payer: 59 | Admitting: Psychiatry

## 2019-09-26 ENCOUNTER — Other Ambulatory Visit: Payer: Self-pay

## 2019-09-26 ENCOUNTER — Encounter (HOSPITAL_COMMUNITY): Payer: Self-pay | Admitting: *Deleted

## 2019-09-26 ENCOUNTER — Emergency Department (HOSPITAL_COMMUNITY)
Admission: EM | Admit: 2019-09-26 | Discharge: 2019-09-27 | Disposition: A | Discharge status: Home / Self Care | Payer: 59 | Attending: Emergency Medicine | Admitting: Emergency Medicine

## 2019-09-26 DIAGNOSIS — K8020 Calculus of gallbladder without cholecystitis without obstruction: Secondary | ICD-10-CM | POA: Diagnosis not present

## 2019-09-26 DIAGNOSIS — K5790 Diverticulosis of intestine, part unspecified, without perforation or abscess without bleeding: Secondary | ICD-10-CM | POA: Diagnosis not present

## 2019-09-26 DIAGNOSIS — R1013 Epigastric pain: Secondary | ICD-10-CM

## 2019-09-26 LAB — URINALYSIS, ROUTINE W REFLEX MICROSCOPIC
Bacteria, UA: NONE SEEN
Bilirubin Urine: NEGATIVE
Glucose, UA: NEGATIVE mg/dL
Ketones, ur: NEGATIVE mg/dL
Leukocytes,Ua: NEGATIVE
Nitrite: NEGATIVE
Protein, ur: NEGATIVE mg/dL
Specific Gravity, Urine: 1.023 (ref 1.005–1.030)
pH: 5 (ref 5.0–8.0)

## 2019-09-26 LAB — PREGNANCY, URINE: Preg Test, Ur: NEGATIVE

## 2019-09-26 MED ORDER — SODIUM CHLORIDE 0.9 % IV BOLUS
1000.0000 mL | Freq: Once | INTRAVENOUS | Status: AC
  Administered 2019-09-26: 1000 mL via INTRAVENOUS

## 2019-09-26 NOTE — ED Triage Notes (Signed)
Pt with abd pain since yesterday, just starting to have some nausea.  Denies emesis or diarrhea.

## 2019-09-27 ENCOUNTER — Emergency Department (HOSPITAL_COMMUNITY): Payer: 59

## 2019-09-27 LAB — CBC
HCT: 41.6 % (ref 36.0–46.0)
HCT: 38.2 % (ref 36.0–46.0)
Hemoglobin: 13.7 g/dL (ref 12.0–15.0)
Hemoglobin: 12.2 g/dL (ref 12.0–15.0)
MCH: 28.8 pg (ref 26.0–34.0)
MCH: 30.4 pg (ref 26.0–34.0)
MCHC: 32.9 g/dL (ref 30.0–36.0)
MCHC: 31.9 g/dL (ref 30.0–36.0)
MCV: 87.4 fL (ref 80.0–100.0)
MCV: 95.3 fL (ref 80.0–100.0)
Platelets: 260 10*3/uL (ref 150–400)
Platelets: 304 10*3/uL (ref 150–400)
RBC: 4.76 MIL/uL (ref 3.87–5.11)
RBC: 4.01 MIL/uL (ref 3.87–5.11)
RDW: 12.4 % (ref 11.5–15.5)
RDW: 11.2 % — ABNORMAL LOW (ref 11.5–15.5)
WBC: 7.6 10*3/uL (ref 4.0–10.5)
WBC: 10.4 10*3/uL (ref 4.0–10.5)
nRBC: 0 % (ref 0.0–0.2)
nRBC: 0 % (ref 0.0–0.2)

## 2019-09-27 LAB — COMPREHENSIVE METABOLIC PANEL
ALT: 14 U/L (ref 0–44)
ALT: 17 U/L (ref 0–44)
AST: 16 U/L (ref 15–41)
AST: 21 U/L (ref 15–41)
Albumin: 3.9 g/dL (ref 3.5–5.0)
Albumin: 4.2 g/dL (ref 3.5–5.0)
Alkaline Phosphatase: 50 U/L (ref 38–126)
Alkaline Phosphatase: 51 U/L (ref 38–126)
Anion gap: 10 (ref 5–15)
Anion gap: 9 (ref 5–15)
BUN: 12 mg/dL (ref 6–20)
BUN: 13 mg/dL (ref 6–20)
CO2: 22 mmol/L (ref 22–32)
CO2: 24 mmol/L (ref 22–32)
Calcium: 8.8 mg/dL — ABNORMAL LOW (ref 8.9–10.3)
Calcium: 9.4 mg/dL (ref 8.9–10.3)
Chloride: 105 mmol/L (ref 98–111)
Chloride: 108 mmol/L (ref 98–111)
Creatinine, Ser: 0.73 mg/dL (ref 0.44–1.00)
Creatinine, Ser: 0.89 mg/dL (ref 0.44–1.00)
GFR calc Af Amer: >60 mL/min (ref >60)
GFR calc Af Amer: >60 mL/min (ref >60)
GFR calc non Af Amer: >60 mL/min (ref >60)
GFR calc non Af Amer: >60 mL/min (ref >60)
Glucose, Bld: 146 mg/dL — ABNORMAL HIGH (ref 70–99)
Glucose, Bld: 91 mg/dL (ref 70–99)
Potassium: 3.5 mmol/L (ref 3.5–5.1)
Potassium: 3.8 mmol/L (ref 3.5–5.1)
Sodium: 138 mmol/L (ref 135–145)
Sodium: 140 mmol/L (ref 135–145)
Total Bilirubin: 0.4 mg/dL (ref 0.3–1.2)
Total Bilirubin: 0.9 mg/dL (ref 0.3–1.2)
Total Protein: 7.3 g/dL (ref 6.5–8.1)
Total Protein: 7.3 g/dL (ref 6.5–8.1)

## 2019-09-27 LAB — LIPASE, BLOOD
Lipase: 21 U/L (ref 11–51)
Lipase: 24 U/L (ref 11–51)

## 2019-09-27 MED ORDER — OMEPRAZOLE 20 MG PO CPDR
DELAYED_RELEASE_CAPSULE | ORAL | 0 refills | Status: DC
Start: 2019-09-27 — End: 2019-10-26

## 2019-09-27 MED ORDER — IOHEXOL 300 MG/ML  SOLN
100.0000 mL | Freq: Once | INTRAMUSCULAR | Status: AC | PRN
  Administered 2019-09-27: 100 mL via INTRAVENOUS

## 2019-09-27 MED ORDER — FENTANYL CITRATE (PF) 100 MCG/2ML IJ SOLN
50.0000 ug | Freq: Once | INTRAMUSCULAR | Status: AC
  Administered 2019-09-27: 50 ug via INTRAVENOUS
  Filled 2019-09-27: qty 2

## 2019-09-27 NOTE — Discharge Instructions (Addendum)
Try to follow a low-fat diet, fatty foods will make your abdominal pain worse.  Please do not eat breakfast in the morning and to you call to get your outpatient ultrasound scheduled hopefully for this morning.  You have to be fasting for 6 to 8 hours before the study.  You will get the results of that ultrasound from the ER doctor that is working tomorrow.  Take the Prilosec.  If you continue to have abdominal pain you should consider seeing the surgeon, Dr. Lovell Sheehan to discuss if you should have your gallbladder removed.  Return to the emergency department for fever, uncontrolled vomiting, or severe pain.

## 2019-09-27 NOTE — ED Provider Notes (Signed)
[facility] EMERGENCY DEPARTMENT Provider Note   CSN: [id number] Arrival date & time: 09/26/19  2033  Time seen 11:37 PM  History Chief Complaint  Patient presents with  . Abdominal Pain    Kaitlin Cole is a 44 y.o. female.  HPI   Patient states yesterday she started getting soreness in her epigastric abdominal area that has been there constantly and got worse today.  She states today it feels sharp.  It waxes and wanes.  It gets worse with movement and eating.  Today it also radiates into her back.  She denies it radiating into her right or left upper abdomen.  She had some nausea this evening coming to the ED.  She denies vomiting or diarrhea or constipation.  She states she had decreased intake today because of fear of making the pain worse.  She states she has had this before a few times that the episodes last 2 to 3 days but this episode is the worst.  It started a couple years ago.  She denies any reflux symptoms.  She denies eating anything different or being around anybody else who is ill.  She denies any abdominal bloating.  PCP Roger Kill PA-C   Past Medical History:  Diagnosis Date  . Headache    only centered around her cycle  . No pertinent past medical history     Patient Active Problem List   Diagnosis Date Noted  . Class 3 severe obesity due to excess calories with body mass index (BMI) of 40.0 to 44.9 in adult (HCC) 03/16/2017  . Pain in joint involving multiple sites 03/16/2017  . H/O: cesarean section 10/02/2011  . Abdominal pain 10/02/2011    Past Surgical History:  Procedure Laterality Date  . CESAREAN SECTION    . KNEE ARTHROSCOPY W/ ACL RECONSTRUCTION  1991 and 1995   Both done at separate times  . TUBAL LIGATION       OB History    Gravida  5   Para  4   Term  4   Preterm      AB  1   Living  4     SAB      TAB  1   Ectopic      Multiple      Live Births              Family History  Problem Relation Age  of Onset  . Other Neg Hx     Social History   Tobacco Use  . Smoking status: Never Smoker  . Smokeless tobacco: Never Used  Substance Use Topics  . Alcohol use: No  . Drug use: No    Home Medications Prior to Admission medications   Medication Sig Start Date End Date Taking? Authorizing Provider  medroxyPROGESTERone (PROVERA) 10 MG tablet TK 1 T PO QD FOR 10 DAYS 04/16/18   [provider]  omeprazole (PRILOSEC) 20 MG capsule Take 1 po BID x 2 weeks then once a day 09/27/19   Devoria Albe, MD  FLUoxetine (PROZAC) 20 MG capsule Take 1 capsule (20 mg total) by mouth daily. 12/12/18 03/20/19  Thresa Ross, MD    Allergies    Penicillins  Review of Systems   Review of Systems  All other systems reviewed and are negative.   Physical Exam Updated Vital Signs BP 124/81 (BP Location: Right Arm)   Pulse 66   Temp 98 F (36.7 C) (Oral)   Resp 16  Ht 5\' 4"  (1.626 m)   Wt 113.4 kg   LMP 09/13/2019   SpO2 100%   BMI 42.91 kg/m   Physical Exam Vitals and nursing note reviewed.  Constitutional:      General: She is not in acute distress.    Appearance: Normal appearance. She is obese.  HENT:     Head: Normocephalic and atraumatic.     Right Ear: External ear normal.     Left Ear: External ear normal.     Nose: Nose normal.     Mouth/Throat:     Mouth: Mucous membranes are dry.  Eyes:     Extraocular Movements: Extraocular movements intact.     Conjunctiva/sclera: Conjunctivae normal.     Pupils: Pupils are equal, round, and reactive to light.  Cardiovascular:     Rate and Rhythm: Normal rate and regular rhythm.  Pulmonary:     Effort: Pulmonary effort is normal. No respiratory distress.     Breath sounds: Normal breath sounds.  Abdominal:     General: Bowel sounds are normal.     Palpations: Abdomen is soft.     Tenderness: There is abdominal tenderness in the epigastric area and left upper quadrant. There is no guarding or rebound.       Comments: Patient  is mostly tender in the epigastric area but has mild tenderness in the left upper quadrant.  Musculoskeletal:        General: Normal range of motion.     Cervical back: Normal range of motion and neck supple.  Skin:    General: Skin is warm and dry.     Findings: No rash.  Neurological:     General: No focal deficit present.     Mental Status: She is alert and oriented to person, place, and time.     Cranial Nerves: No cranial nerve deficit.  Psychiatric:        Mood and Affect: Mood normal.        Behavior: Behavior normal.        Thought Content: Thought content normal.     ED Results / Procedures / Treatments   Labs (all labs ordered are listed, but only abnormal results are displayed) Results for orders placed or performed during the hospital encounter of 09/26/19  Urinalysis, Routine w reflex microscopic  Result Value Ref Range   Color, Urine YELLOW YELLOW   APPearance CLEAR CLEAR   Specific Gravity, Urine 1.023 1.005 - 1.030   pH 5.0 5.0 - 8.0   Glucose, UA NEGATIVE NEGATIVE mg/dL   Hgb urine dipstick SMALL (A) NEGATIVE   Bilirubin Urine NEGATIVE NEGATIVE   Ketones, ur NEGATIVE NEGATIVE mg/dL   Protein, ur NEGATIVE NEGATIVE mg/dL   Nitrite NEGATIVE NEGATIVE   Leukocytes,Ua NEGATIVE NEGATIVE   RBC / HPF 0-5 0 - 5 RBC/hpf   WBC, UA 0-5 0 - 5 WBC/hpf   Bacteria, UA NONE SEEN NONE SEEN   Squamous Epithelial / LPF 0-5 0 - 5   Mucus PRESENT   Pregnancy, urine  Result Value Ref Range   Preg Test, Ur NEGATIVE NEGATIVE  CBC  Result Value Ref Range   WBC 7.6 4.0 - 10.5 K/uL   RBC 4.76 3.87 - 5.11 MIL/uL   Hemoglobin 13.7 12.0 - 15.0 g/dL   HCT 16.141.6 36 - 46 %   MCV 87.4 80.0 - 100.0 fL   MCH 28.8 26.0 - 34.0 pg   MCHC 32.9 30.0 - 36.0 g/dL   RDW 12.4  11.5 - 15.5 %   Platelets 260 150 - 400 K/uL   nRBC 0.0 0.0 - 0.2 %  Comprehensive metabolic panel  Result Value Ref Range   Sodium 140 135 - 145 mmol/L   Potassium 3.5 3.5 - 5.1 mmol/L   Chloride 108 98 - 111 mmol/L    CO2 22 22 - 32 mmol/L   Glucose, Bld 91 70 - 99 mg/dL   BUN 13 6 - 20 mg/dL   Creatinine, Ser 1.32 0.44 - 1.00 mg/dL   Calcium 8.8 (L) 8.9 - 10.3 mg/dL   Total Protein 7.3 6.5 - 8.1 g/dL   Albumin 4.2 3.5 - 5.0 g/dL   AST 16 15 - 41 U/L   ALT 14 0 - 44 U/L   Alkaline Phosphatase 51 38 - 126 U/L   Total Bilirubin 0.4 0.3 - 1.2 mg/dL   GFR calc non Af Amer >60 >60 mL/min   GFR calc Af Amer >60 >60 mL/min   Anion gap 10 5 - 15  Lipase, blood  Result Value Ref Range   Lipase 24 11 - 51 U/L   Laboratory interpretation all normal    EKG None  Radiology CT Abdomen Pelvis W Contrast  Result Date: 09/27/2019 CLINICAL DATA:  Epigastric abdominal pain EXAM: CT ABDOMEN AND PELVIS WITH CONTRAST TECHNIQUE: Multidetector CT imaging of the abdomen and pelvis was performed using the standard protocol following bolus administration of intravenous contrast. CONTRAST:  OMNIPAQUE IOHEXOL 300 MG/ML  SOLN COMPARISON:  None. FINDINGS: Lower chest: The visualized heart size within normal limits. No pericardial fluid/thickening. No hiatal hernia. The visualized portions of the lungs are clear. Hepatobiliary: The liver is normal in density without focal abnormality.The main portal vein is patent. Layering calcified gallstones are present. Pancreas: Unremarkable. No pancreatic ductal dilatation or surrounding inflammatory changes. Spleen: Normal in size without focal abnormality. Adrenals/Urinary Tract: Both adrenal glands appear normal. The kidneys and collecting system appear normal without evidence of urinary tract calculus or hydronephrosis. Bladder is unremarkable. Stomach/Bowel: The stomach, small bowel, and colon are normal in appearance. No inflammatory changes, wall thickening, or obstructive findings. Vascular/Lymphatic: There are no enlarged mesenteric, retroperitoneal, or pelvic lymph nodes. No significant vascular findings are present. Reproductive: The uterus and adnexa are unremarkable. Other:  The small fat containing anterior umbilical hernia is present. Musculoskeletal: No acute or significant osseous findings. IMPRESSION: Diverticulosis without evidence of diverticulitis. Cholelithiasis No other acute intra-abdominal or pelvic pathology to explain the patient's symptoms. Electronically Signed   By: Jonna Clark M.D.   On: 09/27/2019 01:53    Procedures Procedures (including critical care time)  Medications Ordered in ED Medications  sodium chloride 0.9 % bolus 1,000 mL (0 mLs Intravenous Stopped 09/27/19 0237)  iohexol (OMNIPAQUE) 300 MG/ML solution 100 mL (100 mLs Intravenous Contrast Given 09/27/19 0130)  fentaNYL (SUBLIMAZE) injection 50 mcg (50 mcg Intravenous Given 09/27/19 0233)    ED Course  I have reviewed the triage vital signs and the nursing notes.  Pertinent labs & imaging results that were available during my care of the patient were reviewed by me and considered in my medical decision making (see chart for details).    MDM Rules/Calculators/A&P                          When I discussed her laboratory blood test results patient states she has not had her blood drawn.  She states she has only provided a urine sample.  I am going to have nurses investigate this.  However we did discuss getting a CT to evaluate her abdominal pain.  However we also need to clear up whether this is her blood work or somebody else's.  Patient's blood was drawn and was normal.  CT scan showed gallstones without cholecystitis.  We discussed that CT was not the best test to look for inflammation of the gallbladder.  She is going to return for an outpatient ultrasound hopefully today.  She was advised not to eat or drink until she got her appointment time.  Patient had drove herself to the ED but states she can call someone to pick her up so she was given IV pain medication.  We also discussed that she had diverticulosis without diverticulitis so she never got abdominal pain in her left lower  abdomen that would be a concern.  Final Clinical Impression(s) / ED Diagnoses Final diagnoses:  Epigastric abdominal pain  Gallstones  Diverticulosis    Rx / DC Orders ED Discharge Orders         Ordered    US Abdomen Limited RUQ/Gall Gladder     Discontinue     09/27/19 0309    omeprazole (PRILOSEC) 20 MG capsule     Discontinue  Reprint     09/27/19 0310         Plan discharge  Devoria Albe, MD, Concha Pyo, MD 09/27/19 [phone]

## 2019-10-14 ENCOUNTER — Other Ambulatory Visit: Payer: Self-pay | Admitting: Family Medicine

## 2019-10-14 ENCOUNTER — Other Ambulatory Visit (HOSPITAL_COMMUNITY): Payer: Self-pay | Admitting: Family Medicine

## 2019-10-14 DIAGNOSIS — K8050 Calculus of bile duct without cholangitis or cholecystitis without obstruction: Secondary | ICD-10-CM

## 2019-10-16 ENCOUNTER — Ambulatory Visit (HOSPITAL_COMMUNITY)
Admission: RE | Admit: 2019-10-16 | Discharge: 2019-10-16 | Disposition: A | Discharge status: Home / Self Care | Payer: 59 | Source: Ambulatory Visit | Attending: Emergency Medicine | Admitting: Emergency Medicine

## 2019-10-16 ENCOUNTER — Other Ambulatory Visit: Payer: Self-pay

## 2019-10-16 DIAGNOSIS — K8050 Calculus of bile duct without cholangitis or cholecystitis without obstruction: Secondary | ICD-10-CM | POA: Insufficient documentation

## 2019-10-26 ENCOUNTER — Encounter: Payer: Self-pay | Admitting: General Surgery

## 2019-10-26 ENCOUNTER — Ambulatory Visit: Payer: 59 | Admitting: General Surgery

## 2019-10-26 ENCOUNTER — Other Ambulatory Visit: Payer: Self-pay

## 2019-10-26 VITALS — BP 125/82 | HR 64 | Temp 98.3°F | Resp 14 | Ht 64.0 in | Wt 253.0 lb

## 2019-10-26 DIAGNOSIS — K8020 Calculus of gallbladder without cholecystitis without obstruction: Secondary | ICD-10-CM

## 2019-10-26 NOTE — Patient Instructions (Signed)
Laparoscopic Cholecystectomy Laparoscopic cholecystectomy is surgery to remove the gallbladder. The gallbladder is a pear-shaped organ that lies beneath the liver on the right side of the body. The gallbladder stores bile, which is a fluid that helps the body to digest fats. Cholecystectomy is often done for inflammation of the gallbladder (cholecystitis). This condition is usually caused by a buildup of gallstones (cholelithiasis) in the gallbladder. Gallstones can block the flow of bile, which can result in inflammation and pain. In severe cases, emergency surgery may be required. This procedure is done though small incisions in your abdomen (laparoscopic surgery). A thin scope with a camera (laparoscope) is inserted through one incision. Thin surgical instruments are inserted through the other incisions. In some cases, a laparoscopic procedure may be turned into a type of surgery that is done through a larger incision (open surgery). Tell a health care provider about:  Any allergies you have.  All medicines you are taking, including vitamins, herbs, eye drops, creams, and over-the-counter medicines.  Any problems you or family members have had with anesthetic medicines.  Any blood disorders you have.  Any surgeries you have had.  Any medical conditions you have.  Whether you are pregnant or may be pregnant. What are the risks? Generally, this is a safe procedure. However, problems may occur, including:  Infection.  Bleeding.  Allergic reactions to medicines.  Damage to other structures or organs.  A stone remaining in the common bile duct. The common bile duct carries bile from the gallbladder into the small intestine.  A bile leak from the cyst duct that is clipped when your gallbladder is removed. What happens before the procedure?   Medicines  Ask your health care provider about: ? Changing or stopping your regular medicines. This is especially important if you are taking  diabetes medicines or blood thinners. ? Taking medicines such as aspirin and ibuprofen. These medicines can thin your blood. Do not take these medicines before your procedure if your health care provider instructs you not to.  You may be given antibiotic medicine to help prevent infection. General instructions  Let your health care provider know if you develop a cold or an infection before surgery.  Plan to have someone take you home from the hospital or clinic.  Ask your health care provider how your surgical site will be marked or identified. What happens during the procedure?   To reduce your risk of infection: ? Your health care team will wash or sanitize their hands. ? Your skin will be washed with soap. ? Hair may be removed from the surgical area.  An IV tube may be inserted into one of your veins.  You will be given one or more of the following: ? A medicine to help you relax (sedative). ? A medicine to make you fall asleep (general anesthetic).  A breathing tube will be placed in your mouth.  Your surgeon will make several small cuts (incisions) in your abdomen.  The laparoscope will be inserted through one of the small incisions. The camera on the laparoscope will send images to a TV screen (monitor) in the operating room. This lets your surgeon see inside your abdomen.  Air-like gas will be pumped into your abdomen. This will expand your abdomen to give the surgeon more room to perform the surgery.  Other tools that are needed for the procedure will be inserted through the other incisions. The gallbladder will be removed through one of the incisions.  Your common bile duct   may be examined. If stones are found in the common bile duct, they may be removed.  After your gallbladder has been removed, the incisions will be closed with stitches (sutures), staples, or skin glue.  Your incisions may be covered with a bandage (dressing). The procedure may vary among health  care providers and hospitals. What happens after the procedure?  Your blood pressure, heart rate, breathing rate, and blood oxygen level will be monitored until the medicines you were given have worn off.  You will be given medicines as needed to control your pain.  Do not drive for 24 hours if you were given a sedative. This information is not intended to replace advice given to you by your health care provider. Make sure you discuss any questions you have with your health care provider. Document Revised: 02/05/2017 Document Reviewed: 08/12/2015 Elsevier Patient Education  2020 [employer]  

## 2019-10-26 NOTE — Progress Notes (Signed)
Kaitlin Cole; [id number]; 1975/12/21   HPI Patient is a 44 year old white female who was referred to my care by Dr. Rene Kocher for evaluation treatment of biliary colic secondary to cholelithiasis.  Patient has had intermittent episodes of epigastric and right upper quadrant abdominal pain with radiation to the right flank and back, nausea, and fatty food intolerance for many months.  The seems to be increasing in frequency and intensity.  She did have an ultrasound the gallbladder which revealed cholelithiasis but normal common bile duct.  She currently has 2 out of 10 pain.  She denies any fever, chills, or jaundice. Past Medical History:  Diagnosis Date  . Arthritis   . Headache    only centered around her cycle  . No pertinent past medical history     Past Surgical History:  Procedure Laterality Date  . CESAREAN SECTION    . KNEE ARTHROSCOPY W/ ACL RECONSTRUCTION  1991 and 1995   Both done at separate times  . TUBAL LIGATION      Family History  Problem Relation Age of Onset  . Heart disease Mother   . Other Neg Hx     Current Outpatient Medications on File Prior to Visit  Medication Sig Dispense Refill  . [DISCONTINUED] FLUoxetine (PROZAC) 20 MG capsule Take 1 capsule (20 mg total) by mouth daily. 30 capsule 2   No current facility-administered medications on file prior to visit.    Allergies  Allergen Reactions  . Penicillins Hives, Shortness Of Breath, Swelling and Other (See Comments)    Has patient had a PCN reaction causing immediate rash, facial/tongue/throat swelling, SOB or lightheadedness with hypotension: Yes Has patient had a PCN reaction causing severe rash involving mucus membranes or skin necrosis: No Has patient had a PCN reaction that required hospitalization No Has patient had a PCN reaction occurring within the last 10 years: No If all of the above answers are "NO", then may proceed with Cephalosporin use.    Social History   Substance and Sexual  Activity  Alcohol Use No    Social History   Tobacco Use  Smoking Status Never Smoker  Smokeless Tobacco Never Used    Review of Systems  Constitutional: Negative.   HENT: Negative.   Eyes: Positive for blurred vision.  Respiratory: Negative.   Cardiovascular: Negative.   Gastrointestinal: Negative.   Genitourinary: Negative.   Musculoskeletal: Negative.   Skin: Negative.   Neurological: Negative.   Endo/Heme/Allergies: Negative.   Psychiatric/Behavioral: Negative.     Objective   Vitals:   10/26/19 0949  BP: 125/82  Pulse: 64  Resp: 14  Temp: 98.3 F (36.8 C)  SpO2: 92%    Physical Exam Vitals reviewed.  Constitutional:      Appearance: Normal appearance. She is obese. She is not ill-appearing.  HENT:     Head: Normocephalic and atraumatic.  Cardiovascular:     Rate and Rhythm: Normal rate and regular rhythm.     Heart sounds: Normal heart sounds. No murmur heard.  No friction rub. No gallop.   Pulmonary:     Effort: Pulmonary effort is normal. No respiratory distress.     Breath sounds: Normal breath sounds. No stridor. No wheezing, rhonchi or rales.  Abdominal:     General: Bowel sounds are normal. There is no distension.     Palpations: Abdomen is soft. There is no mass.     Tenderness: There is abdominal tenderness. There is no guarding or rebound.     Hernia:  No hernia is present.     Comments: Some discomfort to deep palpation in the right upper quadrant.  Skin:    General: Skin is warm and dry.  Neurological:     Mental Status: She is alert and oriented to person, place, and time.    Primary care notes reviewed Assessment  Biliary colic, cholelithiasis Plan   Patient is scheduled for laparoscopic cholecystectomy on 10/30/2019.  The risks and benefits of the procedure including bleeding, infection, hepatobiliary injury, and the possibility of an open procedure were fully explained to the patient, who gave informed consent.  

## 2019-10-26 NOTE — H&P (Signed)
Kaitlin Cole; [id number]; 1975/12/21   HPI Patient is a 44 year old white female who was referred to my care by Dr. Rene Kocher for evaluation treatment of biliary colic secondary to cholelithiasis.  Patient has had intermittent episodes of epigastric and right upper quadrant abdominal pain with radiation to the right flank and back, nausea, and fatty food intolerance for many months.  The seems to be increasing in frequency and intensity.  She did have an ultrasound the gallbladder which revealed cholelithiasis but normal common bile duct.  She currently has 2 out of 10 pain.  She denies any fever, chills, or jaundice. Past Medical History:  Diagnosis Date  . Arthritis   . Headache    only centered around her cycle  . No pertinent past medical history     Past Surgical History:  Procedure Laterality Date  . CESAREAN SECTION    . KNEE ARTHROSCOPY W/ ACL RECONSTRUCTION  1991 and 1995   Both done at separate times  . TUBAL LIGATION      Family History  Problem Relation Age of Onset  . Heart disease Mother   . Other Neg Hx     Current Outpatient Medications on File Prior to Visit  Medication Sig Dispense Refill  . [DISCONTINUED] FLUoxetine (PROZAC) 20 MG capsule Take 1 capsule (20 mg total) by mouth daily. 30 capsule 2   No current facility-administered medications on file prior to visit.    Allergies  Allergen Reactions  . Penicillins Hives, Shortness Of Breath, Swelling and Other (See Comments)    Has patient had a PCN reaction causing immediate rash, facial/tongue/throat swelling, SOB or lightheadedness with hypotension: Yes Has patient had a PCN reaction causing severe rash involving mucus membranes or skin necrosis: No Has patient had a PCN reaction that required hospitalization No Has patient had a PCN reaction occurring within the last 10 years: No If all of the above answers are "NO", then may proceed with Cephalosporin use.    Social History   Substance and Sexual  Activity  Alcohol Use No    Social History   Tobacco Use  Smoking Status Never Smoker  Smokeless Tobacco Never Used    Review of Systems  Constitutional: Negative.   HENT: Negative.   Eyes: Positive for blurred vision.  Respiratory: Negative.   Cardiovascular: Negative.   Gastrointestinal: Negative.   Genitourinary: Negative.   Musculoskeletal: Negative.   Skin: Negative.   Neurological: Negative.   Endo/Heme/Allergies: Negative.   Psychiatric/Behavioral: Negative.     Objective   Vitals:   10/26/19 0949  BP: 125/82  Pulse: 64  Resp: 14  Temp: 98.3 F (36.8 C)  SpO2: 92%    Physical Exam Vitals reviewed.  Constitutional:      Appearance: Normal appearance. She is obese. She is not ill-appearing.  HENT:     Head: Normocephalic and atraumatic.  Cardiovascular:     Rate and Rhythm: Normal rate and regular rhythm.     Heart sounds: Normal heart sounds. No murmur heard.  No friction rub. No gallop.   Pulmonary:     Effort: Pulmonary effort is normal. No respiratory distress.     Breath sounds: Normal breath sounds. No stridor. No wheezing, rhonchi or rales.  Abdominal:     General: Bowel sounds are normal. There is no distension.     Palpations: Abdomen is soft. There is no mass.     Tenderness: There is abdominal tenderness. There is no guarding or rebound.     Hernia:  No hernia is present.     Comments: Some discomfort to deep palpation in the right upper quadrant.  Skin:    General: Skin is warm and dry.  Neurological:     Mental Status: She is alert and oriented to person, place, and time.    Primary care notes reviewed Assessment  Biliary colic, cholelithiasis Plan   Patient is scheduled for laparoscopic cholecystectomy on 10/30/2019.  The risks and benefits of the procedure including bleeding, infection, hepatobiliary injury, and the possibility of an open procedure were fully explained to the patient, who gave informed consent.

## 2019-10-27 ENCOUNTER — Other Ambulatory Visit (HOSPITAL_COMMUNITY)
Admission: RE | Admit: 2019-10-27 | Discharge: 2019-10-27 | Disposition: A | Discharge status: Home / Self Care | Payer: 59 | Source: Ambulatory Visit | Attending: General Surgery | Admitting: General Surgery

## 2019-10-27 ENCOUNTER — Encounter (HOSPITAL_COMMUNITY)
Admission: RE | Admit: 2019-10-27 | Discharge: 2019-10-27 | Disposition: A | Discharge status: Home / Self Care | Payer: 59 | Source: Ambulatory Visit | Attending: General Surgery | Admitting: General Surgery

## 2019-10-27 DIAGNOSIS — Z20822 Contact with and (suspected) exposure to covid-19: Secondary | ICD-10-CM | POA: Insufficient documentation

## 2019-10-27 DIAGNOSIS — Z01812 Encounter for preprocedural laboratory examination: Secondary | ICD-10-CM | POA: Insufficient documentation

## 2019-10-27 LAB — PREGNANCY, URINE: Preg Test, Ur: NEGATIVE

## 2019-10-28 LAB — SARS CORONAVIRUS 2 (TAT 6-24 HRS): SARS Coronavirus 2: NEGATIVE

## 2019-10-30 ENCOUNTER — Ambulatory Visit (HOSPITAL_COMMUNITY): Payer: 59 | Admitting: Anesthesiology

## 2019-10-30 ENCOUNTER — Ambulatory Visit (HOSPITAL_COMMUNITY)
Admission: RE | Admit: 2019-10-30 | Discharge: 2019-10-30 | Disposition: A | Discharge status: Home / Self Care | Payer: 59 | Attending: General Surgery | Admitting: General Surgery

## 2019-10-30 ENCOUNTER — Encounter (HOSPITAL_COMMUNITY)
Admission: RE | Disposition: A | Discharge status: Home / Self Care | Payer: Self-pay | Source: Home / Self Care | Attending: General Surgery

## 2019-10-30 ENCOUNTER — Encounter (HOSPITAL_COMMUNITY): Payer: Self-pay | Admitting: General Surgery

## 2019-10-30 DIAGNOSIS — Z6841 Body Mass Index (BMI) 40.0 and over, adult: Secondary | ICD-10-CM | POA: Insufficient documentation

## 2019-10-30 DIAGNOSIS — K8020 Calculus of gallbladder without cholecystitis without obstruction: Secondary | ICD-10-CM

## 2019-10-30 DIAGNOSIS — K8010 Calculus of gallbladder with chronic cholecystitis without obstruction: Secondary | ICD-10-CM | POA: Diagnosis not present

## 2019-10-30 HISTORY — PX: CHOLECYSTECTOMY: SHX55

## 2019-10-30 SURGERY — LAPAROSCOPIC CHOLECYSTECTOMY
Anesthesia: General

## 2019-10-30 MED ORDER — ROCURONIUM BROMIDE 10 MG/ML (PF) SYRINGE
PREFILLED_SYRINGE | INTRAVENOUS | Status: AC
  Filled 2019-10-30: qty 10

## 2019-10-30 MED ORDER — CIPROFLOXACIN IN D5W 400 MG/200ML IV SOLN
INTRAVENOUS | Status: AC
  Filled 2019-10-30: qty 200

## 2019-10-30 MED ORDER — SODIUM CHLORIDE 0.9 % IR SOLN
Status: DC | PRN
  Administered 2019-10-30: 1000 mL

## 2019-10-30 MED ORDER — CIPROFLOXACIN IN D5W 400 MG/200ML IV SOLN
400.0000 mg | INTRAVENOUS | Status: AC
  Administered 2019-10-30: 400 mg via INTRAVENOUS

## 2019-10-30 MED ORDER — PROPOFOL 10 MG/ML IV BOLUS
INTRAVENOUS | Status: AC
  Filled 2019-10-30: qty 20

## 2019-10-30 MED ORDER — CHLORHEXIDINE GLUCONATE 0.12 % MT SOLN
OROMUCOSAL | Status: AC
  Filled 2019-10-30: qty 15

## 2019-10-30 MED ORDER — SUCCINYLCHOLINE CHLORIDE 20 MG/ML IJ SOLN
INTRAMUSCULAR | Status: DC | PRN
  Administered 2019-10-30: 180 mg via INTRAVENOUS

## 2019-10-30 MED ORDER — ONDANSETRON HCL 4 MG/2ML IJ SOLN
INTRAMUSCULAR | Status: AC
  Filled 2019-10-30: qty 2

## 2019-10-30 MED ORDER — ORAL CARE MOUTH RINSE: 15.0000 mL | Freq: Once | OROMUCOSAL | Status: AC

## 2019-10-30 MED ORDER — CHLORHEXIDINE GLUCONATE CLOTH 2 % EX PADS: 6.0000 | MEDICATED_PAD | Freq: Once | CUTANEOUS | Status: DC

## 2019-10-30 MED ORDER — LIDOCAINE 2% (20 MG/ML) 5 ML SYRINGE
INTRAMUSCULAR | Status: AC
  Filled 2019-10-30: qty 5

## 2019-10-30 MED ORDER — FENTANYL CITRATE (PF) 100 MCG/2ML IJ SOLN
25.0000 ug | INTRAMUSCULAR | Status: DC | PRN
  Administered 2019-10-30: 50 ug via INTRAVENOUS
  Filled 2019-10-30: qty 2

## 2019-10-30 MED ORDER — FENTANYL CITRATE (PF) 250 MCG/5ML IJ SOLN
INTRAMUSCULAR | Status: AC
  Filled 2019-10-30: qty 5

## 2019-10-30 MED ORDER — LIDOCAINE HCL (CARDIAC) PF 50 MG/5ML IV SOSY
PREFILLED_SYRINGE | INTRAVENOUS | Status: DC | PRN
  Administered 2019-10-30: 100 mg via INTRAVENOUS

## 2019-10-30 MED ORDER — LACTATED RINGERS IV SOLN: INTRAVENOUS | Status: DC

## 2019-10-30 MED ORDER — DEXAMETHASONE SODIUM PHOSPHATE 10 MG/ML IJ SOLN
INTRAMUSCULAR | Status: DC | PRN
  Administered 2019-10-30: 4 mg via INTRAVENOUS

## 2019-10-30 MED ORDER — PROPOFOL 10 MG/ML IV BOLUS
INTRAVENOUS | Status: DC | PRN
  Administered 2019-10-30: 200 mg via INTRAVENOUS

## 2019-10-30 MED ORDER — CHLORHEXIDINE GLUCONATE 0.12 % MT SOLN
15.0000 mL | Freq: Once | OROMUCOSAL | Status: AC
  Administered 2019-10-30: 15 mL via OROMUCOSAL

## 2019-10-30 MED ORDER — ROCURONIUM BROMIDE 100 MG/10ML IV SOLN
INTRAVENOUS | Status: DC | PRN
  Administered 2019-10-30: 40 mg via INTRAVENOUS
  Administered 2019-10-30: 10 mg via INTRAVENOUS

## 2019-10-30 MED ORDER — SUGAMMADEX SODIUM 500 MG/5ML IV SOLN
INTRAVENOUS | Status: DC | PRN
  Administered 2019-10-30: 200 mg via INTRAVENOUS

## 2019-10-30 MED ORDER — ONDANSETRON HCL 4 MG/2ML IJ SOLN: 4.0000 mg | Freq: Once | INTRAMUSCULAR | Status: DC | PRN

## 2019-10-30 MED ORDER — SUCCINYLCHOLINE CHLORIDE 200 MG/10ML IV SOSY
PREFILLED_SYRINGE | INTRAVENOUS | Status: AC
  Filled 2019-10-30: qty 20

## 2019-10-30 MED ORDER — HYDROCODONE-ACETAMINOPHEN 10-325 MG PO TABS: 1.0000 | ORAL_TABLET | Freq: Four times a day (QID) | ORAL | 0 refills | Status: AC | PRN

## 2019-10-30 MED ORDER — MIDAZOLAM HCL 2 MG/2ML IJ SOLN
INTRAMUSCULAR | Status: DC | PRN
  Administered 2019-10-30: 2 mg via INTRAVENOUS

## 2019-10-30 MED ORDER — HEMOSTATIC AGENTS (NO CHARGE) OPTIME
TOPICAL | Status: DC | PRN
  Administered 2019-10-30: 1 via TOPICAL

## 2019-10-30 MED ORDER — BUPIVACAINE LIPOSOME 1.3 % IJ SUSP
INTRAMUSCULAR | Status: DC | PRN
  Administered 2019-10-30: 20 mL

## 2019-10-30 MED ORDER — MIDAZOLAM HCL 2 MG/2ML IJ SOLN
INTRAMUSCULAR | Status: AC
  Filled 2019-10-30: qty 2

## 2019-10-30 MED ORDER — BUPIVACAINE LIPOSOME 1.3 % IJ SUSP
INTRAMUSCULAR | Status: AC
  Filled 2019-10-30: qty 10

## 2019-10-30 MED ORDER — ONDANSETRON HCL 4 MG/2ML IJ SOLN
INTRAMUSCULAR | Status: DC | PRN
  Administered 2019-10-30: 4 mg via INTRAVENOUS

## 2019-10-30 MED ORDER — KETOROLAC TROMETHAMINE 30 MG/ML IJ SOLN
30.0000 mg | Freq: Once | INTRAMUSCULAR | Status: AC
  Administered 2019-10-30: 30 mg via INTRAVENOUS
  Filled 2019-10-30: qty 1

## 2019-10-30 MED ORDER — FENTANYL CITRATE (PF) 100 MCG/2ML IJ SOLN
INTRAMUSCULAR | Status: DC | PRN
  Administered 2019-10-30: 50 ug via INTRAVENOUS
  Administered 2019-10-30: 100 ug via INTRAVENOUS
  Administered 2019-10-30 (×2): 50 ug via INTRAVENOUS

## 2019-10-30 SURGICAL SUPPLY — 46 items
ADH SKN CLS APL DERMABOND .7 (GAUZE/BANDAGES/DRESSINGS) ×1
APL SRG 38 LTWT LNG FL B (MISCELLANEOUS)
APPLICATOR ARISTA FLEXITIP XL (MISCELLANEOUS) IMPLANT
APPLIER CLIP ROT 10 11.4 M/L (STAPLE) ×3
APR CLP MED LRG 11.4X10 (STAPLE) ×1
BAG RETRIEVAL 10 (BASKET) ×1
BAG RETRIEVAL 10MM (BASKET) ×1
CLIP APPLIE ROT 10 11.4 M/L (STAPLE) ×1 IMPLANT
CLOTH BEACON ORANGE TIMEOUT ST (SAFETY) ×3 IMPLANT
COVER LIGHT HANDLE STERIS (MISCELLANEOUS) ×6 IMPLANT
COVER WAND RF STERILE (DRAPES) ×3 IMPLANT
DERMABOND ADVANCED (GAUZE/BANDAGES/DRESSINGS) ×2
DERMABOND ADVANCED .7 DNX12 (GAUZE/BANDAGES/DRESSINGS) ×1 IMPLANT
DURAPREP 26ML APPLICATOR (WOUND CARE) ×3 IMPLANT
ELECT REM PT RETURN 9FT ADLT (ELECTROSURGICAL) ×3
ELECTRODE REM PT RTRN 9FT ADLT (ELECTROSURGICAL) ×1 IMPLANT
GLOVE BIOGEL PI IND STRL 7.0 (GLOVE) ×2 IMPLANT
GLOVE BIOGEL PI INDICATOR 7.0 (GLOVE) ×4
GLOVE SURG SS PI 7.5 STRL IVOR (GLOVE) ×3 IMPLANT
GOWN STRL REUS W/TWL LRG LVL3 (GOWN DISPOSABLE) ×9 IMPLANT
HEMOSTAT ARISTA ABSORB 3G PWDR (HEMOSTASIS) IMPLANT
HEMOSTAT SNOW SURGICEL 2X4 (HEMOSTASIS) ×3 IMPLANT
INST SET LAPROSCOPIC AP (KITS) ×3 IMPLANT
KIT TURNOVER KIT A (KITS) ×3 IMPLANT
MANIFOLD NEPTUNE II (INSTRUMENTS) ×3 IMPLANT
NDL HYPO 18GX1.5 BLUNT FILL (NEEDLE) ×1 IMPLANT
NEEDLE HYPO 18GX1.5 BLUNT FILL (NEEDLE) ×3 IMPLANT
NEEDLE HYPO 22GX1.5 SAFETY (NEEDLE) ×3 IMPLANT
NEEDLE INSUFFLATION 14GA 120MM (NEEDLE) ×3 IMPLANT
NS IRRIG 1000ML POUR BTL (IV SOLUTION) ×3 IMPLANT
PACK LAP CHOLE LZT030E (CUSTOM PROCEDURE TRAY) ×3 IMPLANT
PAD ARMBOARD 7.5X6 YLW CONV (MISCELLANEOUS) ×3 IMPLANT
SET BASIN LINEN APH (SET/KITS/TRAYS/PACK) ×3 IMPLANT
SET TUBE SMOKE EVAC HIGH FLOW (TUBING) ×3 IMPLANT
SLEEVE ENDOPATH XCEL 5M (ENDOMECHANICALS) ×3 IMPLANT
SUT MNCRL AB 4-0 PS2 18 (SUTURE) ×6 IMPLANT
SUT VICRYL 0 UR6 27IN ABS (SUTURE) ×3 IMPLANT
SYR 20ML LL LF (SYRINGE) ×6 IMPLANT
SYS BAG RETRIEVAL 10MM (BASKET) ×1
SYSTEM BAG RETRIEVAL 10MM (BASKET) ×1 IMPLANT
TROCAR ENDO BLADELESS 11MM (ENDOMECHANICALS) ×3 IMPLANT
TROCAR XCEL NON-BLD 5MMX100MML (ENDOMECHANICALS) ×3 IMPLANT
TROCAR XCEL UNIV SLVE 11M 100M (ENDOMECHANICALS) ×3 IMPLANT
TUBE CONNECTING 12'X1/4 (SUCTIONS) ×1
TUBE CONNECTING 12X1/4 (SUCTIONS) ×2 IMPLANT
WARMER LAPAROSCOPE (MISCELLANEOUS) ×3 IMPLANT

## 2019-10-30 NOTE — Transfer of Care (Signed)
Immediate Anesthesia Transfer of Care Note  Patient: Kaitlin Cole  Procedure(s) Performed: LAPAROSCOPIC CHOLECYSTECTOMY (N/A )  Patient Location: PACU  Anesthesia Type:General  Level of Consciousness: awake, alert , oriented and patient cooperative  Airway & Oxygen Therapy: Patient Spontanous Breathing and Patient connected to nasal cannula oxygen  Post-op Assessment: Report given to RN, Post -op Vital signs reviewed and stable and Patient moving all extremities  Post vital signs: Reviewed and stable  Last Vitals:  Vitals Value Taken Time  BP 111/66 10/30/19 1200  Temp    Pulse 72 10/30/19 1200  Resp 15 10/30/19 1200  SpO2 99 % 10/30/19 1200  Vitals shown include unvalidated device data.  Last Pain:  Vitals:   10/30/19 1039  TempSrc: Oral  PainSc: 0-No pain      Patients Stated Pain Goal: 5 (10/30/19 1039)  Complications: No complications documented.

## 2019-10-30 NOTE — Anesthesia Postprocedure Evaluation (Signed)
Anesthesia Post Note  Patient: Kaitlin Cole  Procedure(s) Performed: LAPAROSCOPIC CHOLECYSTECTOMY (N/A )  Patient location during evaluation: PACU Anesthesia Type: General Level of consciousness: awake, oriented, awake and alert and patient cooperative Pain management: pain level controlled Vital Signs Assessment: post-procedure vital signs reviewed and stable Respiratory status: spontaneous breathing, respiratory function stable, nonlabored ventilation and patient connected to nasal cannula oxygen Cardiovascular status: blood pressure returned to baseline and stable Postop Assessment: no headache and no backache Anesthetic complications: no   No complications documented.   Last Vitals:  Vitals:   10/30/19 1039  BP: (!) 104/93  Pulse: 83  Resp: 14  Temp: 37.1 C  SpO2: 100%    Last Pain:  Vitals:   10/30/19 1039  TempSrc: Oral  PainSc: 0-No pain                 Brynda Peon

## 2019-10-30 NOTE — Anesthesia Procedure Notes (Signed)
Procedure Name: Intubation Performed by: Shivangi Lutz L, CRNA Pre-anesthesia Checklist: Patient identified, Emergency Drugs available, Suction available, Patient being monitored and Timeout performed Patient Re-evaluated:Patient Re-evaluated prior to induction Oxygen Delivery Method: Circle system utilized Preoxygenation: Pre-oxygenation with 100% oxygen Induction Type: IV induction Laryngoscope Size: Miller and 2 Grade View: Grade II Tube size: 7.0 mm Number of attempts: 1 Airway Equipment and Method: Stylet Placement Confirmation: ETT inserted through vocal cords under direct vision,  positive ETCO2,  CO2 detector and breath sounds checked- equal and bilateral Secured at: 21 cm Tube secured with: Tape Dental Injury: Teeth and Oropharynx as per pre-operative assessment        

## 2019-10-30 NOTE — Anesthesia Preprocedure Evaluation (Signed)
Anesthesia Evaluation  Patient identified by MRN, date of birth, ID band Patient awake    Reviewed: Allergy & Precautions, H&P , NPO status , Patient's Chart, lab work & pertinent test results, reviewed documented beta blocker date and time   Airway Mallampati: I  TM Distance: >3 FB Neck ROM: full    Dental no notable dental hx. (+) Teeth Intact   Pulmonary neg pulmonary ROS,    Pulmonary exam normal breath sounds clear to auscultation       Cardiovascular Exercise Tolerance: Good negative cardio ROS   Rhythm:regular Rate:Normal     Neuro/Psych  Headaches, negative psych ROS   GI/Hepatic negative GI ROS, Neg liver ROS,   Endo/Other  Morbid obesity  Renal/GU negative Renal ROS  negative genitourinary   Musculoskeletal   Abdominal   Peds  Hematology negative hematology ROS (+)   Anesthesia Other Findings   Reproductive/Obstetrics negative OB ROS                             Anesthesia Physical Anesthesia Plan  ASA: II  Anesthesia Plan: General   Post-op Pain Management:    Induction:   PONV Risk Score and Plan: Ondansetron  Airway Management Planned:   Additional Equipment:   Intra-op Plan:   Post-operative Plan:   Informed Consent: I have reviewed the patients History and Physical, chart, labs and discussed the procedure including the risks, benefits and alternatives for the proposed anesthesia with the patient or authorized representative who has indicated his/her understanding and acceptance.     Dental Advisory Given  Plan Discussed with: CRNA  Anesthesia Plan Comments:         Anesthesia Quick Evaluation

## 2019-10-30 NOTE — Op Note (Signed)
Patient:  Kaitlin Cole  DOB:  08-Oct-1975  MRN:  [id number]   Preop Diagnosis: Biliary colic, cholelithiasis  Postop Diagnosis: Same  Procedure: Laparoscopic cholecystectomy  Surgeon: Franky Macho, MD  Assistant: Algis Greenhouse, MD  Anes: General endotracheal  Indications: Patient is a 44 year old white female who presents with biliary colic secondary to cholelithiasis.  The risks and benefits of the procedure including bleeding, infection, hepatobiliary injury, and the possibility of an open procedure were fully explained to the patient, who gave informed consent.  Procedure note: The patient was placed in supine position.  After induction of general endotracheal anesthesia, the abdomen was prepped and draped using the usual sterile technique with ChloraPrep.  Surgical site confirmation was performed.  A supraumbilical incision was made down to the fascia.  A Veress needle was introduced into the abdominal cavity and confirmation of placement was done using the saline drop test.  The abdomen was then insufflated to 15 mmHg pressure.  An 11 mm trocar was introduced into the abdominal cavity under direct visualization without difficulty.  The patient was placed in reverse Trendelenburg position and an additional 11 mm trocar was placed in the epigastric region and 5 mm trochars were placed the right upper quadrant and right flank regions.  The liver was inspected and noted to be within normal limits.  The gallbladder was retracted in a dynamic fashion in order to provide a critical view of the triangle of Calot.  The cystic artery was first identified.  Its juncture to the infundibulum was fully identified.  Endoclips were placed proximally and distally on the cystic artery, and the artery was divided.  The cystic duct was then fully visualized and endoclips were placed proximally and distally on the cystic duct, and the cystic duct was divided.  The gallbladder was freed away from the  gallbladder fossa using Bovie electrocautery.  The gallbladder was delivered through the epigastric trocar site using an Endo Catch bag.  The gallbladder fossa was inspected and no abnormal bleeding or bile leakage was noted.  Surgicel was placed in the gallbladder fossa.  All fluid and air were then evacuated from the abdominal cavity prior to removal of the trochars.  All wounds were irrigated with normal saline.  All wounds were injected with Exparel.  All incisions were closed using a 4-0 Monocryl subcuticular suture.  Dermabond was applied.  All tape and needle counts were correct at the end of the procedure.  The patient was extubated in the operating room and transferred to PACU in stable condition.  Complications: None  EBL: Minimal  Specimen: Gallbladder

## 2019-10-30 NOTE — Interval H&P Note (Signed)
History and Physical Interval Note:  10/30/2019 10:54 AM  Kaitlin Cole  has presented today for surgery, with the diagnosis of Cholelithiasis.  The various methods of treatment have been discussed with the patient and family. After consideration of risks, benefits and other options for treatment, the patient has consented to  Procedure(s): LAPAROSCOPIC CHOLECYSTECTOMY (N/A) as a surgical intervention.  The patient's history has been reviewed, patient examined, no change in status, stable for surgery.  I have reviewed the patient's chart and labs.  Questions were answered to the patient's satisfaction.     Franky Macho

## 2019-10-30 NOTE — Discharge Instructions (Signed)
Laparoscopic Cholecystectomy, Care After This sheet gives you information about how to care for yourself after your procedure. Your health care provider may also give you more specific instructions. If you have problems or questions, contact your health care provider. What can I expect after the procedure? After the procedure, it is common to have:  Pain at your incision sites. You will be given medicines to control this pain.  Mild nausea or vomiting.  Bloating and possible shoulder pain from the air-like gas that was used during the procedure. Follow these instructions at home: Incision care   Follow instructions from your health care provider about how to take care of your incisions. Make sure you: ? Wash your hands with soap and water before you change your bandage (dressing). If soap and water are not available, use hand sanitizer. ? Change your dressing as told by your health care provider. ? Leave stitches (sutures), skin glue, or adhesive strips in place. These skin closures may need to be in place for 2 weeks or longer. If adhesive strip edges start to loosen and curl up, you may trim the loose edges. Do not remove adhesive strips completely unless your health care provider tells you to do that.  Do not take baths, swim, or use a hot tub until your health care provider approves. Ask your health care provider if you can take showers. You may only be allowed to take sponge baths for bathing.  Check your incision area every day for signs of infection. Check for: ? More redness, swelling, or pain. ? More fluid or blood. ? Warmth. ? Pus or a bad smell. Activity  Do not drive or use heavy machinery while taking prescription pain medicine.  Do not lift anything that is heavier than 10 lb (4.5 kg) until your health care provider approves.  Do not play contact sports until your health care provider approves.  Do not drive for 24 hours if you were given a medicine to help you relax  (sedative).  Rest as needed. Do not return to work or school until your health care provider approves. General instructions  Take over-the-counter and prescription medicines only as told by your health care provider.  To prevent or treat constipation while you are taking prescription pain medicine, your health care provider may recommend that you: ? Drink enough fluid to keep your urine clear or pale yellow. ? Take over-the-counter or prescription medicines. ? Eat foods that are high in fiber, such as fresh fruits and vegetables, whole grains, and beans. ? Limit foods that are high in fat and processed sugars, such as fried and sweet foods. Contact a health care provider if:  You develop a rash.  You have more redness, swelling, or pain around your incisions.  You have more fluid or blood coming from your incisions.  Your incisions feel warm to the touch.  You have pus or a bad smell coming from your incisions.  You have a fever.  One or more of your incisions breaks open. Get help right away if:  You have trouble breathing.  You have chest pain.  You have increasing pain in your shoulders.  You faint or feel dizzy when you stand.  You have severe pain in your abdomen.  You have nausea or vomiting that lasts for more than one day.  You have leg pain. This information is not intended to replace advice given to you by your health care provider. Make sure you discuss any questions you have   with your health care provider. Document Revised: 02/05/2017 Document Reviewed: 08/12/2015 Elsevier Patient Education  2020 [employer]   General Anesthesia, Adult, Care After This sheet gives you information about how to care for yourself after your procedure. Your health care provider may also give you more specific instructions. If you have problems or questions, contact your health care provider. What can I expect after the procedure? After the procedure, the following side  effects are common:  Pain or discomfort at the IV site.  Nausea.  Vomiting.  Sore throat.  Trouble concentrating.  Feeling cold or chills.  Weak or tired.  Sleepiness and fatigue.  Soreness and body aches. These side effects can affect parts of the body that were not involved in surgery. Follow these instructions at home:  For at least 24 hours after the procedure:  Have a responsible adult stay with you. It is important to have someone help care for you until you are awake and alert.  Rest as needed.  Do not: ? Participate in activities in which you could fall or become injured. ? Drive. ? Use heavy machinery. ? Drink alcohol. ? Take sleeping pills or medicines that cause drowsiness. ? Make important decisions or sign legal documents. ? Take care of children on your own. Eating and drinking  Follow any instructions from your health care provider about eating or drinking restrictions.  When you feel hungry, start by eating small amounts of foods that are soft and easy to digest (bland), such as toast. Gradually return to your regular diet.  Drink enough fluid to keep your urine pale yellow.  If you vomit, rehydrate by drinking water, juice, or clear broth. General instructions  If you have sleep apnea, surgery and certain medicines can increase your risk for breathing problems. Follow instructions from your health care provider about wearing your sleep device: ? Anytime you are sleeping, including during daytime naps. ? While taking prescription pain medicines, sleeping medicines, or medicines that make you drowsy.  Return to your normal activities as told by your health care provider. Ask your health care provider what activities are safe for you.  Take over-the-counter and prescription medicines only as told by your health care provider.  If you smoke, do not smoke without supervision.  Keep all follow-up visits as told by your health care provider. This is  important. Contact a health care provider if:  You have nausea or vomiting that does not get better with medicine.  You cannot eat or drink without vomiting.  You have pain that does not get better with medicine.  You are unable to pass urine.  You develop a skin rash.  You have a fever.  You have redness around your IV site that gets worse. Get help right away if:  You have difficulty breathing.  You have chest pain.  You have blood in your urine or stool, or you vomit blood. Summary  After the procedure, it is common to have a sore throat or nausea. It is also common to feel tired.  Have a responsible adult stay with you for the first 24 hours after general anesthesia. It is important to have someone help care for you until you are awake and alert.  When you feel hungry, start by eating small amounts of foods that are soft and easy to digest (bland), such as toast. Gradually return to your regular diet.  Drink enough fluid to keep your urine pale yellow.  Return to your normal activities as   told by your health care provider. Ask your health care provider what activities are safe for you. This information is not intended to replace advice given to you by your health care provider. Make sure you discuss any questions you have with your health care provider. Document Revised: 02/26/2017 Document Reviewed: 10/09/2016 Elsevier Patient Education  2020 Elsevier Inc.  

## 2019-10-31 ENCOUNTER — Encounter (HOSPITAL_COMMUNITY): Payer: Self-pay | Admitting: General Surgery

## 2019-10-31 LAB — SURGICAL PATHOLOGY

## 2019-11-14 ENCOUNTER — Telehealth: Payer: 59 | Admitting: General Surgery

## 2019-12-08 ENCOUNTER — Other Ambulatory Visit: Payer: Self-pay

## 2019-12-08 ENCOUNTER — Emergency Department (HOSPITAL_COMMUNITY)
Admission: EM | Admit: 2019-12-08 | Discharge: 2019-12-09 | Disposition: A | Discharge status: Home / Self Care | Payer: 59 | Attending: Emergency Medicine | Admitting: Emergency Medicine

## 2019-12-08 ENCOUNTER — Encounter (HOSPITAL_COMMUNITY): Payer: Self-pay | Admitting: Emergency Medicine

## 2019-12-08 ENCOUNTER — Emergency Department (HOSPITAL_COMMUNITY): Payer: 59

## 2019-12-08 DIAGNOSIS — R059 Cough, unspecified: Secondary | ICD-10-CM | POA: Diagnosis present

## 2019-12-08 DIAGNOSIS — U071 COVID-19: Secondary | ICD-10-CM | POA: Diagnosis not present

## 2019-12-08 LAB — COMPREHENSIVE METABOLIC PANEL
ALT: 19 U/L (ref 0–44)
AST: 25 U/L (ref 15–41)
Albumin: 3.5 g/dL (ref 3.5–5.0)
Alkaline Phosphatase: 45 U/L (ref 38–126)
Anion gap: 9 (ref 5–15)
BUN: 6 mg/dL (ref 6–20)
CO2: 23 mmol/L (ref 22–32)
Calcium: 7.8 mg/dL — ABNORMAL LOW (ref 8.9–10.3)
Chloride: 105 mmol/L (ref 98–111)
Creatinine, Ser: 0.67 mg/dL (ref 0.44–1.00)
GFR calc Af Amer: >60 mL/min (ref >60)
GFR calc non Af Amer: >60 mL/min (ref >60)
Glucose, Bld: 104 mg/dL — ABNORMAL HIGH (ref 70–99)
Potassium: 3.4 mmol/L — ABNORMAL LOW (ref 3.5–5.1)
Sodium: 137 mmol/L (ref 135–145)
Total Bilirubin: 0.5 mg/dL (ref 0.3–1.2)
Total Protein: 6.4 g/dL — ABNORMAL LOW (ref 6.5–8.1)

## 2019-12-08 LAB — CBC WITH DIFFERENTIAL/PLATELET
Abs Immature Granulocytes: 0.01 10*3/uL (ref 0.00–0.07)
Basophils Absolute: 0 10*3/uL (ref 0.0–0.1)
Basophils Relative: 0 %
Eosinophils Absolute: 0 10*3/uL (ref 0.0–0.5)
Eosinophils Relative: 0 %
HCT: 37.5 % (ref 36.0–46.0)
Hemoglobin: 12.4 g/dL (ref 12.0–15.0)
Immature Granulocytes: 0 %
Lymphocytes Relative: 36 %
Lymphs Abs: 0.9 10*3/uL (ref 0.7–4.0)
MCH: 28.8 pg (ref 26.0–34.0)
MCHC: 33.1 g/dL (ref 30.0–36.0)
MCV: 87 fL (ref 80.0–100.0)
Monocytes Absolute: 0.2 10*3/uL (ref 0.1–1.0)
Monocytes Relative: 9 %
Neutro Abs: 1.4 10*3/uL — ABNORMAL LOW (ref 1.7–7.7)
Neutrophils Relative %: 55 %
Platelets: 142 10*3/uL — ABNORMAL LOW (ref 150–400)
RBC: 4.31 MIL/uL (ref 3.87–5.11)
RDW: 12.4 % (ref 11.5–15.5)
WBC: 2.6 10*3/uL — ABNORMAL LOW (ref 4.0–10.5)
nRBC: 0 % (ref 0.0–0.2)

## 2019-12-08 LAB — TROPONIN I (HIGH SENSITIVITY)
Troponin I (High Sensitivity): 4 ng/L (ref <18)
Troponin I (High Sensitivity): 4 ng/L (ref <18)

## 2019-12-08 NOTE — ED Triage Notes (Signed)
Pt states she was seen by PCP today, told she may have PNA d/t crackles and dehydration. Pt states she is on day 10, COVID +,  Pt reports she has not received covid vaccine.

## 2019-12-09 MED ORDER — SODIUM CHLORIDE 0.9 % IV BOLUS
1000.0000 mL | Freq: Once | INTRAVENOUS | Status: AC
  Administered 2019-12-09: 1000 mL via INTRAVENOUS

## 2019-12-09 MED ORDER — KETOROLAC TROMETHAMINE 30 MG/ML IJ SOLN
15.0000 mg | Freq: Once | INTRAMUSCULAR | Status: AC
  Administered 2019-12-09: 15 mg via INTRAVENOUS
  Filled 2019-12-09: qty 1

## 2019-12-09 NOTE — Discharge Instructions (Signed)
You have symptoms consistent with ongoing Covid infection.  Unfortunately, this illness can take up to several weeks to improve. If you develop new, or concerning changes, return here.  Otherwise, follow-up with your physician.

## 2019-12-09 NOTE — ED Notes (Signed)
Pt ambulated in room on RA, O2 sats 98-100.

## 2019-12-09 NOTE — ED Provider Notes (Addendum)
[facility] EMERGENCY DEPARTMENT Provider Note   CSN: [id number] Arrival date & time: 12/08/19  1958     History Chief Complaint  Patient presents with  . Shortness of Breath    Kaitlin Cole is a 44 y.o. female.  HPI    Patient presents with ongoing cough, weakness, fatigue Symptoms began about 10 days ago, and 4 days ago the patient was diagnosed positive for coronavirus. She has not received her vaccines. She has had persistent symptoms throughout her illness, now worse over the past few days. This is occurred in spite of using Tylenol. She saw her physician yesterday, and with concern for cough, possible abnormal auscultation on exam, she was sent here for evaluation. Patient states that she is generally well, does not smoke, does not drink.  Past Medical History:  Diagnosis Date  . Arthritis   . Headache    only centered around her cycle  . No pertinent past medical history     Patient Active Problem List   Diagnosis Date Noted  . Calculus of gallbladder without cholecystitis without obstruction   . Pain in joint of right hip 05/09/2019  . Left foot pain 09/22/2018  . Plantar fasciitis of right foot 09/22/2018  . Class 3 severe obesity due to excess calories with body mass index (BMI) of 40.0 to 44.9 in adult (HCC) 03/16/2017  . Pain in joint involving multiple sites 03/16/2017  . H/O: cesarean section 10/02/2011  . Abdominal pain 10/02/2011    Past Surgical History:  Procedure Laterality Date  . CESAREAN SECTION    . CHOLECYSTECTOMY N/A 10/30/2019   Procedure: LAPAROSCOPIC CHOLECYSTECTOMY;  Surgeon: Franky Macho, MD;  Location: [facility];  Service: General;  Laterality: N/A;  . KNEE ARTHROSCOPY W/ ACL RECONSTRUCTION  1991 and 1995   Both done at separate times  . TUBAL LIGATION       OB History    Gravida  5   Para  4   Term  4   Preterm      AB  1   Living  4     SAB      TAB  1   Ectopic      Multiple      Live  Births              Family History  Problem Relation Age of Onset  . Heart disease Mother   . Other Neg Hx     Social History   Tobacco Use  . Smoking status: Never Smoker  . Smokeless tobacco: Never Used  Substance Use Topics  . Alcohol use: No  . Drug use: No    Home Medications Prior to Admission medications   Medication Sig Start Date End Date Taking? Authorizing Provider  HYDROcodone-acetaminophen (NORCO) 10-325 MG tablet Take 1 tablet by mouth every 6 (six) hours as needed. 10/30/19   Franky Macho, MD  meloxicam (MOBIC) 15 MG tablet Take 7.5 mg by mouth daily as needed for pain.    [provider]  Multiple Vitamin (MULTIVITAMIN WITH MINERALS) TABS tablet Take 1 tablet by mouth at bedtime.    [provider]  FLUoxetine (PROZAC) 20 MG capsule Take 1 capsule (20 mg total) by mouth daily. 12/12/18 03/20/19  Thresa Ross, MD    Allergies    Penicillins  Review of Systems   Review of Systems  Constitutional:       Per HPI, otherwise negative  HENT:  Per HPI, otherwise negative  Respiratory:       Per HPI, otherwise negative  Cardiovascular:       Per HPI, otherwise negative  Gastrointestinal: Positive for nausea. Negative for abdominal pain and vomiting.  Endocrine:       Negative aside from HPI  Genitourinary:       Neg aside from HPI   Musculoskeletal:       Per HPI, otherwise negative  Skin: Negative.   Neurological: Positive for weakness. Negative for syncope.    Physical Exam Updated Vital Signs BP 97/71 (BP Location: Right Arm)   Pulse 73   Temp 99.1 F (37.3 C) (Oral)   Resp 16   SpO2 98%   Physical Exam Vitals and nursing note reviewed.  Constitutional:      General: She is not in acute distress.    Appearance: She is well-developed. She is obese.  HENT:     Head: Normocephalic and atraumatic.  Eyes:     Conjunctiva/sclera: Conjunctivae normal.  Cardiovascular:     Rate and Rhythm: Normal rate and regular  rhythm.  Pulmonary:     Effort: Pulmonary effort is normal. No respiratory distress.     Breath sounds: Normal breath sounds. No stridor.  Abdominal:     General: There is no distension.  Skin:    General: Skin is warm and dry.  Neurological:     Mental Status: She is alert and oriented to person, place, and time.     Cranial Nerves: No cranial nerve deficit.     ED Results / Procedures / Treatments   Labs (all labs ordered are listed, but only abnormal results are displayed) Labs Reviewed  CBC WITH DIFFERENTIAL/PLATELET - Abnormal; Notable for the following components:      Result Value   WBC 2.6 (*)    Platelets 142 (*)    Neutro Abs 1.4 (*)    All other components within normal limits  COMPREHENSIVE METABOLIC PANEL - Abnormal; Notable for the following components:   Potassium 3.4 (*)    Glucose, Bld 104 (*)    Calcium 7.8 (*)    Total Protein 6.4 (*)    All other components within normal limits  TROPONIN I (HIGH SENSITIVITY)  TROPONIN I (HIGH SENSITIVITY)    Radiology DG Chest Portable 1 View  Result Date: 12/08/2019 CLINICAL DATA:  Crackles EXAM: PORTABLE CHEST 1 VIEW COMPARISON:  12/31/2006 FINDINGS: The heart size and mediastinal contours are within normal limits. Both lungs are clear. The visualized skeletal structures are unremarkable. IMPRESSION: No active disease. Electronically Signed   By: Jasmine Pang M.D.   On: 12/08/2019 20:37     EKG Interpretation  Date/Time:  Saturday December 09 2019 10:08:28 EDT Ventricular Rate:  58 PR Interval:    QRS Duration: 84 QT Interval:  440 QTC Calculation: 433 R Axis:   65 Text Interpretation: Sinus rhythm Low voltage, precordial leads Otherwise within normal limits Confirmed by Gerhard Munch [phone]) on 12/09/2019 10:12:45 AM        Procedures Procedures (including critical care time)  Medications Ordered in ED Medications  sodium chloride 0.9 % bolus 1,000 mL (has no administration in time range)  ketorolac  (TORADOL) 30 MG/ML injection 15 mg (has no administration in time range)    ED Course  I have reviewed the triage vital signs and the nursing notes.  Pertinent labs & imaging results that were available during my care of the patient were reviewed by me and considered  in my medical decision making (see chart for details).  10:13 AM Patient receiving IV fluids, no distress, no hemodynamic instability.  Adult female, nonvaccinated, presents with 10 days of ongoing Covid illness. Patient is awake, alert, has no oxygen requirement, as x-rays that did not demonstrate concurrent pneumonia.  Labs generally reassuring/consistent with Covid infection. With concern for weakness, mild dehydration, the patient did receive fluids, Toradol. With otherwise reassuring labs, x-ray, no evidence for decompensated state after 10 days of illness, after conversations with the patient about her illness, she was discharged in stable condition.  Kaitlin Cole was evaluated in Emergency Department on 12/09/2019 for the symptoms described in the history of present illness. She was evaluated in the context of the global COVID-19 pandemic, which necessitated consideration that the patient might be at risk for infection with the SARS-CoV-2 virus that causes COVID-19. Institutional protocols and algorithms that pertain to the evaluation of patients at risk for COVID-19 are in a state of rapid change based on information released by regulatory bodies including the CDC and federal and state organizations. These policies and algorithms were followed during the patient's care in the ED.  Final Clinical Impression(s) / ED Diagnoses Final diagnoses:  COVID     Gerhard Munch, MD 12/09/19 1829    Gerhard Munch, MD 12/09/19 1013

## 2020-11-14 ENCOUNTER — Encounter (INDEPENDENT_AMBULATORY_CARE_PROVIDER_SITE_OTHER): Payer: Self-pay | Admitting: *Deleted

## 2020-11-19 ENCOUNTER — Other Ambulatory Visit: Payer: Self-pay | Admitting: Family Medicine

## 2020-11-19 DIAGNOSIS — Z1231 Encounter for screening mammogram for malignant neoplasm of breast: Secondary | ICD-10-CM

## 2020-12-03 ENCOUNTER — Encounter (INDEPENDENT_AMBULATORY_CARE_PROVIDER_SITE_OTHER): Payer: Self-pay | Admitting: *Deleted

## 2020-12-26 ENCOUNTER — Ambulatory Visit (INDEPENDENT_AMBULATORY_CARE_PROVIDER_SITE_OTHER): Payer: 59 | Admitting: Gastroenterology

## 2021-01-01 ENCOUNTER — Other Ambulatory Visit: Payer: Self-pay | Admitting: Rehabilitation

## 2021-01-01 DIAGNOSIS — M5136 Other intervertebral disc degeneration, lumbar region: Secondary | ICD-10-CM

## 2021-01-01 DIAGNOSIS — M47816 Spondylosis without myelopathy or radiculopathy, lumbar region: Secondary | ICD-10-CM

## 2021-01-21 ENCOUNTER — Ambulatory Visit (INDEPENDENT_AMBULATORY_CARE_PROVIDER_SITE_OTHER): Payer: 59 | Admitting: Gastroenterology

## 2021-02-26 ENCOUNTER — Inpatient Hospital Stay: Admission: RE | Admit: 2021-02-26 | Payer: 59 | Source: Ambulatory Visit

## 2021-05-19 ENCOUNTER — Encounter (INDEPENDENT_AMBULATORY_CARE_PROVIDER_SITE_OTHER): Payer: Self-pay | Admitting: *Deleted

## 2021-05-19 ENCOUNTER — Ambulatory Visit (INDEPENDENT_AMBULATORY_CARE_PROVIDER_SITE_OTHER): Payer: 59 | Admitting: Gastroenterology

## 2021-05-19 ENCOUNTER — Encounter (INDEPENDENT_AMBULATORY_CARE_PROVIDER_SITE_OTHER): Payer: Self-pay | Admitting: Gastroenterology

## 2021-07-21 ENCOUNTER — Other Ambulatory Visit: Payer: Self-pay

## 2021-07-21 ENCOUNTER — Emergency Department (HOSPITAL_COMMUNITY)
Admission: EM | Admit: 2021-07-21 | Discharge: 2021-07-22 | Disposition: A | Discharge status: Home / Self Care | Payer: Self-pay | Attending: Emergency Medicine | Admitting: Emergency Medicine

## 2021-07-21 ENCOUNTER — Emergency Department (HOSPITAL_COMMUNITY): Payer: Self-pay

## 2021-07-21 ENCOUNTER — Encounter (HOSPITAL_COMMUNITY): Payer: Self-pay | Admitting: Emergency Medicine

## 2021-07-21 DIAGNOSIS — R197 Diarrhea, unspecified: Secondary | ICD-10-CM | POA: Insufficient documentation

## 2021-07-21 DIAGNOSIS — R1084 Generalized abdominal pain: Secondary | ICD-10-CM | POA: Insufficient documentation

## 2021-07-21 DIAGNOSIS — R630 Anorexia: Secondary | ICD-10-CM | POA: Insufficient documentation

## 2021-07-21 DIAGNOSIS — R110 Nausea: Secondary | ICD-10-CM | POA: Insufficient documentation

## 2021-07-21 LAB — URINALYSIS, ROUTINE W REFLEX MICROSCOPIC
Bacteria, UA: NONE SEEN
Bilirubin Urine: NEGATIVE
Glucose, UA: NEGATIVE mg/dL
Ketones, ur: NEGATIVE mg/dL
Leukocytes,Ua: NEGATIVE
Nitrite: NEGATIVE
Protein, ur: NEGATIVE mg/dL
Specific Gravity, Urine: 1.005 (ref 1.005–1.030)
pH: 6 (ref 5.0–8.0)

## 2021-07-21 LAB — COMPREHENSIVE METABOLIC PANEL
ALT: 14 U/L (ref 0–44)
AST: 14 U/L — ABNORMAL LOW (ref 15–41)
Albumin: 3.9 g/dL (ref 3.5–5.0)
Alkaline Phosphatase: 71 U/L (ref 38–126)
Anion gap: 4 — ABNORMAL LOW (ref 5–15)
BUN: 8 mg/dL (ref 6–20)
CO2: 26 mmol/L (ref 22–32)
Calcium: 8.6 mg/dL — ABNORMAL LOW (ref 8.9–10.3)
Chloride: 111 mmol/L (ref 98–111)
Creatinine, Ser: 0.6 mg/dL (ref 0.44–1.00)
GFR, Estimated: >60 mL/min (ref >60)
Glucose, Bld: 91 mg/dL (ref 70–99)
Potassium: 3.9 mmol/L (ref 3.5–5.1)
Sodium: 141 mmol/L (ref 135–145)
Total Bilirubin: 0.4 mg/dL (ref 0.3–1.2)
Total Protein: 7 g/dL (ref 6.5–8.1)

## 2021-07-21 LAB — CBC
HCT: 38.5 % (ref 36.0–46.0)
Hemoglobin: 12.4 g/dL (ref 12.0–15.0)
MCH: 28.6 pg (ref 26.0–34.0)
MCHC: 32.2 g/dL (ref 30.0–36.0)
MCV: 88.7 fL (ref 80.0–100.0)
Platelets: 200 10*3/uL (ref 150–400)
RBC: 4.34 MIL/uL (ref 3.87–5.11)
RDW: 13.2 % (ref 11.5–15.5)
WBC: 4.9 10*3/uL (ref 4.0–10.5)
nRBC: 0 % (ref 0.0–0.2)

## 2021-07-21 LAB — LIPASE, BLOOD: Lipase: 21 U/L (ref 11–51)

## 2021-07-21 LAB — POC URINE PREG, ED: Preg Test, Ur: NEGATIVE

## 2021-07-21 MED ORDER — IOHEXOL 300 MG/ML  SOLN
100.0000 mL | Freq: Once | INTRAMUSCULAR | Status: AC | PRN
  Administered 2021-07-21: 100 mL via INTRAVENOUS

## 2021-07-21 MED ORDER — ONDANSETRON 4 MG PO TBDP: 4.0000 mg | ORAL_TABLET | Freq: Three times a day (TID) | ORAL | 0 refills | Status: AC | PRN

## 2021-07-21 NOTE — ED Provider Notes (Addendum)
?[facility] ?Provider Note ? ? ?CSN: [id number] ?Arrival date & time: 07/21/21  1212 ? ?  ? ?History ? ?Chief Complaint  ?Patient presents with  ? Abdominal Pain  ? ? ?Kaitlin Cole is a 46 y.o. female. ? ?T complains of abdominal pain for 1 week.  Pt reports decreased appetite.  Pt reports symptoms started with diarrhea.  Pt reports she has diarrhea when ever she eats.  Pt states decreased fluid intake.   ? ?The history is provided by the patient. No language interpreter was used.  ?Abdominal Pain ?Pain location:  Generalized ?Pain quality: aching   ?Pain radiates to:  Does not radiate ?Pain severity:  Moderate ?Onset quality:  Gradual ?Timing:  Constant ?Progression:  Worsening ?Chronicity:  New ?Context: not medication withdrawal   ?Relieved by:  Nothing ?Worsened by:  Nothing ?Ineffective treatments:  None tried ?Associated symptoms: nausea   ?Associated symptoms: no anorexia   ?Risk factors: no alcohol abuse and not pregnant   ? ?  ? ?Home Medications ?Prior to Admission medications   ?Medication Sig Start Date End Date Taking? Authorizing Provider  ?HYDROcodone-acetaminophen (NORCO) 10-325 MG tablet Take 1 tablet by mouth every 6 (six) hours as needed. 10/30/19   Aviva Signs, MD  ?meloxicam (MOBIC) 15 MG tablet Take 7.5 mg by mouth daily as needed for pain.    [provider]  ?Multiple Vitamin (MULTIVITAMIN WITH MINERALS) TABS tablet Take 1 tablet by mouth at bedtime.    [provider]  ?FLUoxetine (PROZAC) 20 MG capsule Take 1 capsule (20 mg total) by mouth daily. 12/12/18 03/20/19  Merian Capron, MD  ?   ? ?Allergies    ?Penicillins   ? ?Review of Systems   ?Review of Systems  ?Gastrointestinal:  Positive for abdominal pain and nausea. Negative for anorexia.  ?All other systems reviewed and are negative. ? ?Physical Exam ?Updated Vital Signs ?BP 122/65 (BP Location: Left Arm)   Pulse (!) 58   Temp 97.6 ?F (36.4 ?C)   Resp 17   Ht 5\' 4"  (1.626 m)   Wt 108.9 kg    LMP 07/07/2021   SpO2 100%   BMI 41.20 kg/m?  ?Physical Exam ?Vitals and nursing note reviewed.  ?Constitutional:   ?   Appearance: She is well-developed.  ?HENT:  ?   Head: Normocephalic.  ?Eyes:  ?   Extraocular Movements: Extraocular movements intact.  ?Cardiovascular:  ?   Rate and Rhythm: Normal rate and regular rhythm.  ?Pulmonary:  ?   Effort: Pulmonary effort is normal.  ?Abdominal:  ?   General: Abdomen is flat. Bowel sounds are normal. There is no distension.  ?   Palpations: Abdomen is soft.  ?   Tenderness: There is generalized abdominal tenderness and tenderness in the right lower quadrant.  ?   Hernia: No hernia is present.  ?Musculoskeletal:     ?   General: Normal range of motion.  ?   Cervical back: Normal range of motion.  ?Skin: ?   General: Skin is warm.  ?Neurological:  ?   General: No focal deficit present.  ?   Mental Status: She is alert and oriented to person, place, and time.  ?Psychiatric:     ?   Mood and Affect: Mood normal.  ? ? ?ED Results / Procedures / Treatments   ?Labs ?(all labs ordered are listed, but only abnormal results are displayed) ?Labs Reviewed  ?COMPREHENSIVE METABOLIC PANEL - Abnormal; Notable for the following components:  ?  Result Value  ? Calcium 8.6 (*)   ? AST 14 (*)   ? Anion gap 4 (*)   ? All other components within normal limits  ?URINALYSIS, ROUTINE W REFLEX MICROSCOPIC - Abnormal; Notable for the following components:  ? Hgb urine dipstick SMALL (*)   ? All other components within normal limits  ?POC URINE PREG, ED - Normal  ?LIPASE, BLOOD  ?CBC  ? ? ?EKG ?None ? ?Radiology ?CT ABDOMEN PELVIS W CONTRAST ? ?Result Date: 07/21/2021 ?CLINICAL DATA:  Acute abdominal pain. EXAM: CT ABDOMEN AND PELVIS WITH CONTRAST TECHNIQUE: Multidetector CT imaging of the abdomen and pelvis was performed using the standard protocol following bolus administration of intravenous contrast. RADIATION DOSE REDUCTION: This exam was performed according to the departmental  dose-optimization program which includes automated exposure control, adjustment of the mA and/or kV according to patient size and/or use of iterative reconstruction technique. CONTRAST:  161mL OMNIPAQUE IOHEXOL 300 MG/ML  SOLN COMPARISON:  CT abdomen and pelvis 09/27/2019 FINDINGS: Lower chest: No acute abnormality. Hepatobiliary: No focal liver abnormality is seen. Status post cholecystectomy. No biliary dilatation. Pancreas: Unremarkable. No pancreatic ductal dilatation or surrounding inflammatory changes. Spleen: Normal in size without focal abnormality. Adrenals/Urinary Tract: Adrenal glands are unremarkable. Kidneys are normal, without renal calculi, focal lesion, or hydronephrosis. Bladder is unremarkable. Stomach/Bowel: Stomach is within normal limits. Appendix appears normal. No evidence of bowel wall thickening, distention, or inflammatory changes. Vascular/Lymphatic: No significant vascular findings are present. No enlarged abdominal or pelvic lymph nodes. Reproductive: Uterus and bilateral adnexa are unremarkable. Other: There is a small fat containing umbilical hernia. There is no ascites. Musculoskeletal: Degenerative changes affect the spine. IMPRESSION: 1. No acute localizing process in the abdomen or pelvis. 2. Fat containing umbilical hernia. 3. Cholecystectomy. Electronically Signed   By: Ronney Asters M.D.   On: 07/21/2021 23:01   ? ?Procedures ?Procedures  ? ? ?Medications Ordered in ED ?Medications  ?iohexol (OMNIPAQUE) 300 MG/ML solution 100 mL (100 mLs Intravenous Contrast Given 07/21/21 2241)  ? ? ?ED Course/ Medical Decision Making/ A&P ?  ?                        ?Medical Decision Making ?Pt reports diarrhea for 1 week.  Increasing abdominal pain  ? ?Amount and/or Complexity of Data Reviewed ?External Data Reviewed: notes. ?   Details: primary care notes reviewed ?Labs: ordered. Decision-making details documented in ED Course. ?   Details: labs ordered, reviewed and interpreted.  Cbc and  cmet no acute abnormality,  Ua negative ?Radiology: ordered and independent interpretation performed. Decision-making details documented in ED Course. ?   Details: Ct abdomen  no acute process ? ?Risk ?OTC drugs. ?Prescription drug management. ? ? ? ? ? ? ? ? ? ? ?Final Clinical Impression(s) / ED Diagnoses ?Final diagnoses:  ?Diarrhea, unspecified type  ?Generalized abdominal pain  ? ? ?Rx / DC Orders ?ED Discharge Orders   ? ?      Ordered  ?  ondansetron (ZOFRAN-ODT) 4 MG disintegrating tablet  Every 8 hours PRN       ? 07/21/21 2317  ? ?  ?  ? ?  ? ?An After Visit Summary was printed and given to the patient. ? ?  ?Fransico Meadow, [place] ?07/21/21 2317 ? ?  ?Fransico Meadow, [place] ?07/21/21 2341 ? ?  ?Lorelle Gibbs, DO ?07/22/21 2107 ? ?

## 2021-07-21 NOTE — Discharge Instructions (Addendum)
See your Physician for recheck.  Try imodium for diarrhea. Zofran as prescribed  ?

## 2021-07-21 NOTE — ED Triage Notes (Signed)
Abd pain x 1 week with intermittent constipation/diarrhea and nausea. Pt reports pain when eating or drinking. Poor po intake x 1 week.  ?

## 2022-04-02 ENCOUNTER — Telehealth: Payer: Self-pay

## 2022-04-02 NOTE — Telephone Encounter (Signed)
Patient called [facility] from [place] office. Patient needs diagnostic imaging. After answering screening questions patient exceeds federal poverty guidelines. Patient will call back with more income information after returning to her home.

## 2022-06-11 ENCOUNTER — Other Ambulatory Visit: Payer: Self-pay | Admitting: Family Medicine

## 2022-06-11 ENCOUNTER — Ambulatory Visit
Admission: RE | Admit: 2022-06-11 | Discharge: 2022-06-11 | Disposition: A | Discharge status: Home / Self Care | Payer: BC Managed Care – PPO | Source: Ambulatory Visit | Attending: Family Medicine | Admitting: Family Medicine

## 2022-06-11 DIAGNOSIS — M1990 Unspecified osteoarthritis, unspecified site: Secondary | ICD-10-CM

## 2022-07-02 IMAGING — CT CT ABD-PELV W/ CM
2 of 4 series · 16 of 46 positions shown, 18 images · IV contrast (Omnipaque or Isovue)
Comparison: None.

CLINICAL DATA: Epigastric abdominal pain

EXAM:
CT ABDOMEN AND PELVIS WITH CONTRAST
TECHNIQUE: Multidetector CT imaging of the abdomen and pelvis was performed
using the standard protocol following bolus administration of
intravenous contrast.
CONTRAST:  100mL OMNIPAQUE IOHEXOL 300 MG/ML  SOLN

[Series 2: axial st · axial · 0.98mm/px · z∈[+1003,+1413]mm · 13 of 92 slices shown, 15 images]
[im 5/92  soft-tissue]
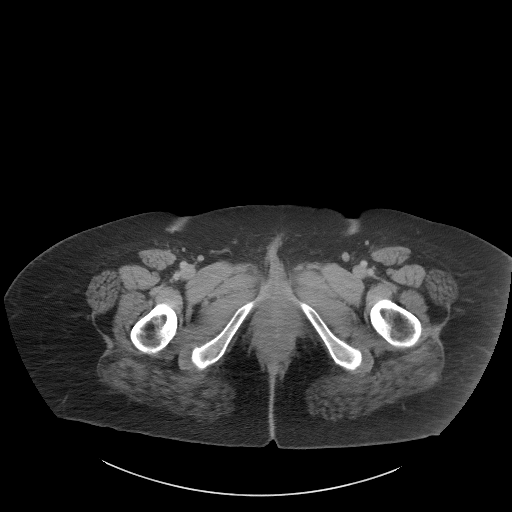
[im 5/92  bone]
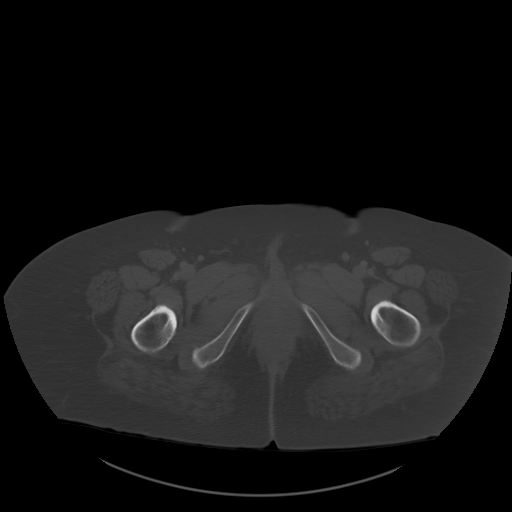
[im 14/92  soft-tissue]
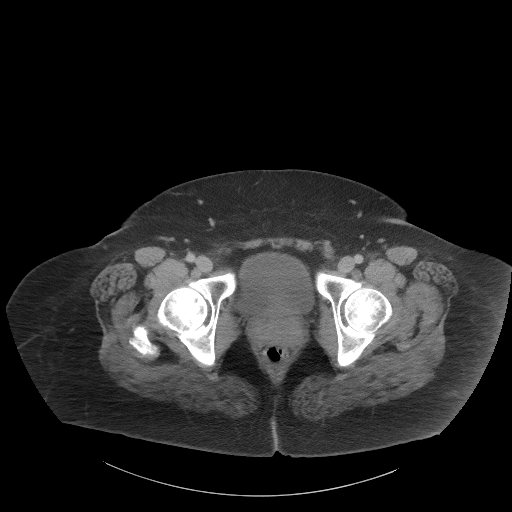
[im 18/92  soft-tissue]
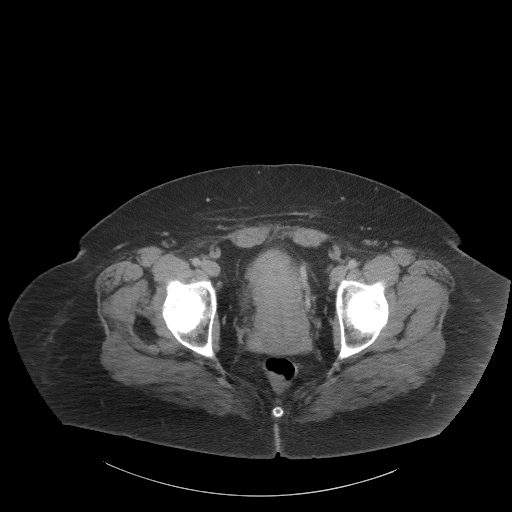
[im 27/92  soft-tissue]
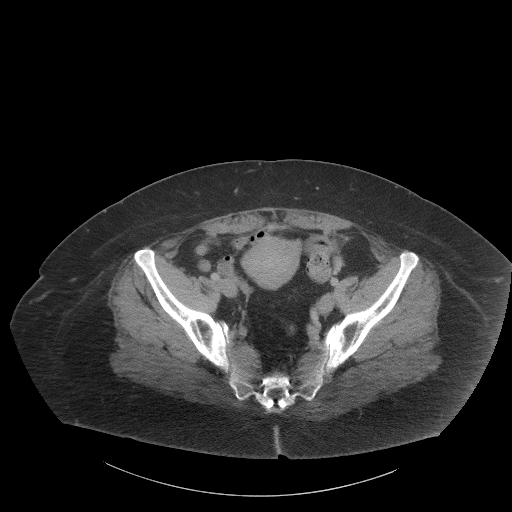
[im 31/92  soft-tissue]
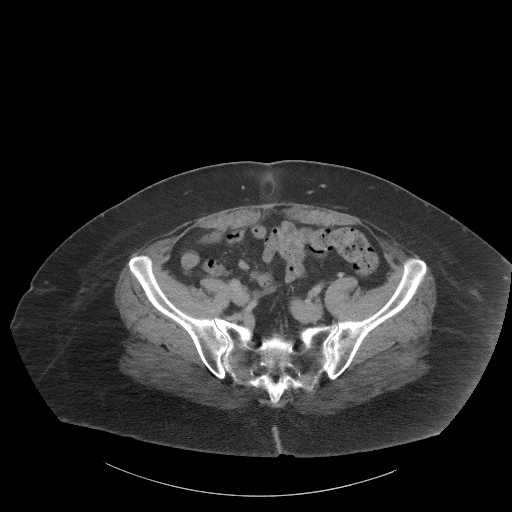
[im 40/92  soft-tissue]
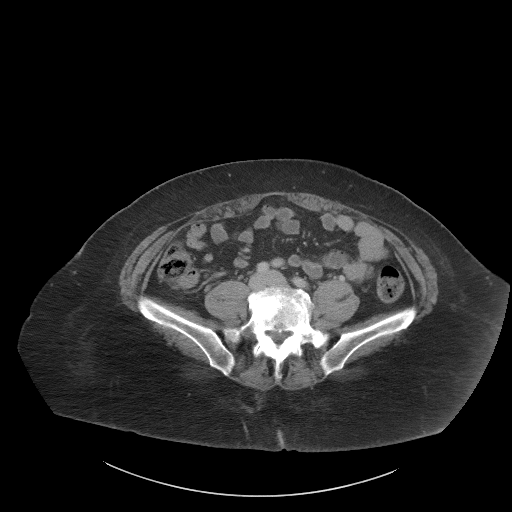
[im 48/92  soft-tissue]
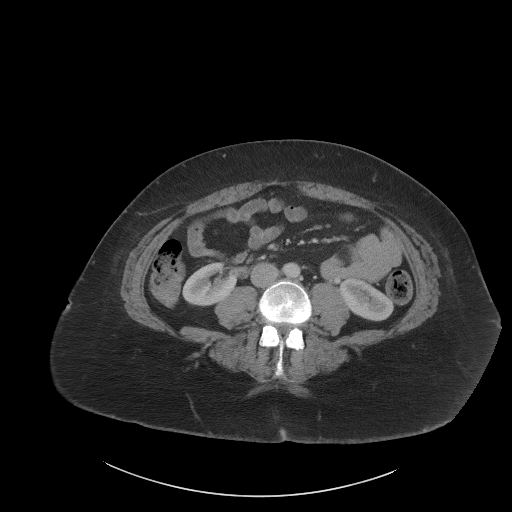
[im 53/92  soft-tissue]
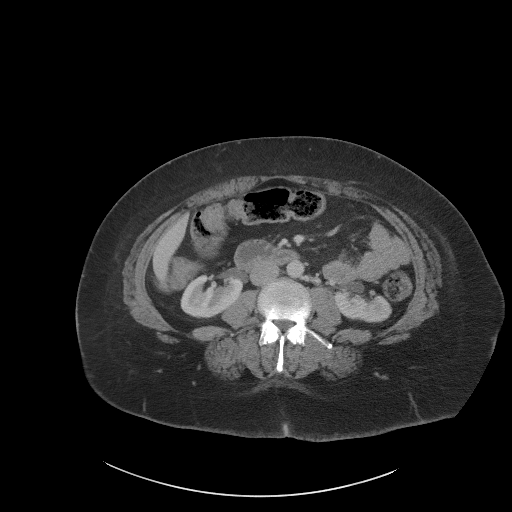
[im 61/92  soft-tissue]
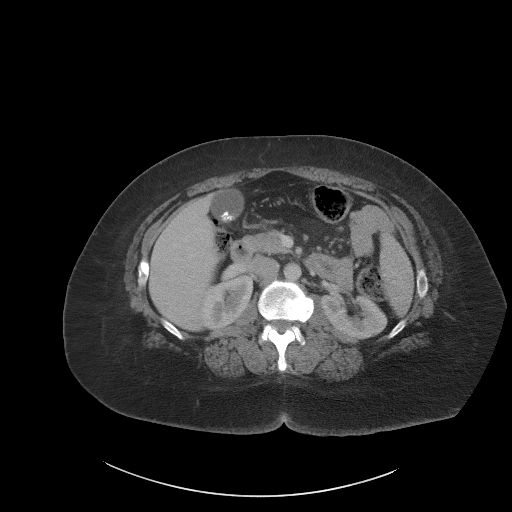
[im 61/92  bone]
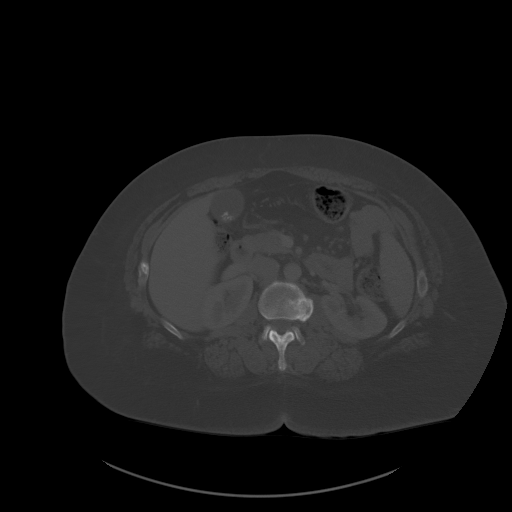
[im 66/92  soft-tissue]
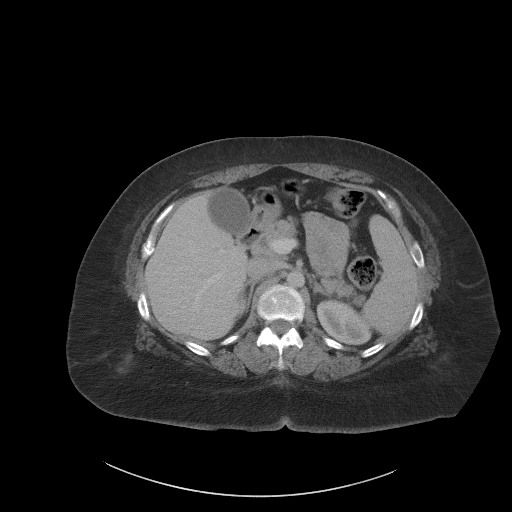
[im 74/92  soft-tissue]
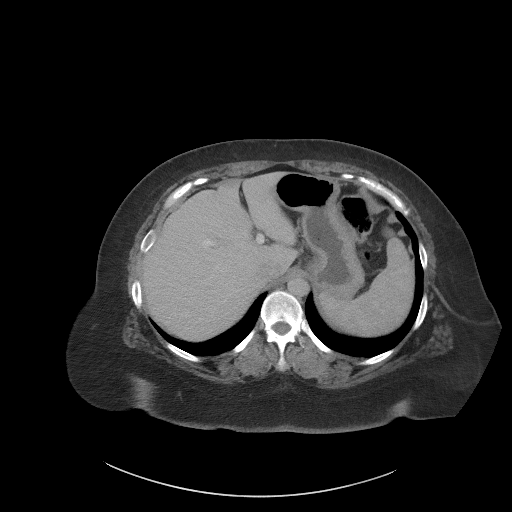
[im 79/92  soft-tissue]
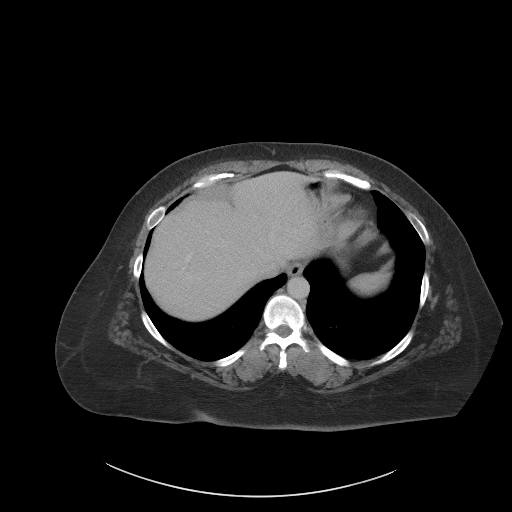
[im 87/92  soft-tissue]
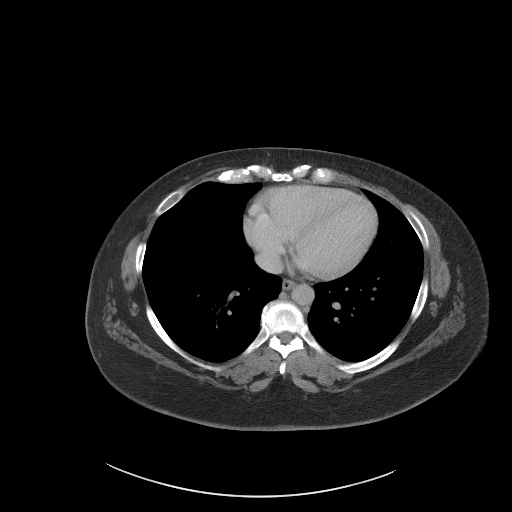

[Series 5: coronal st · coronal · 0.80mm/px · 3 of 99 slices shown]
[im 33/99  soft-tissue]
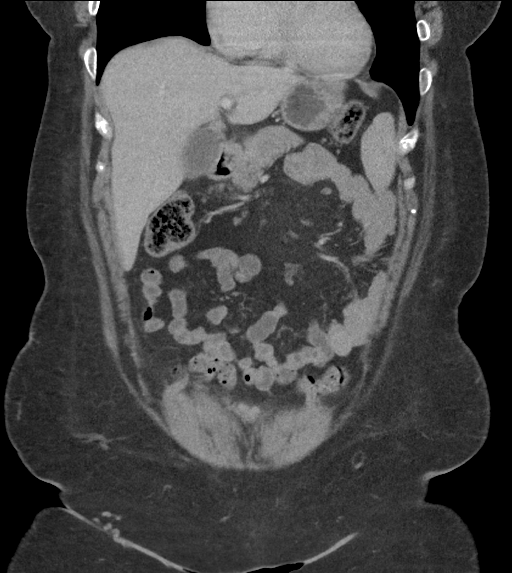
[im 44/99  soft-tissue]
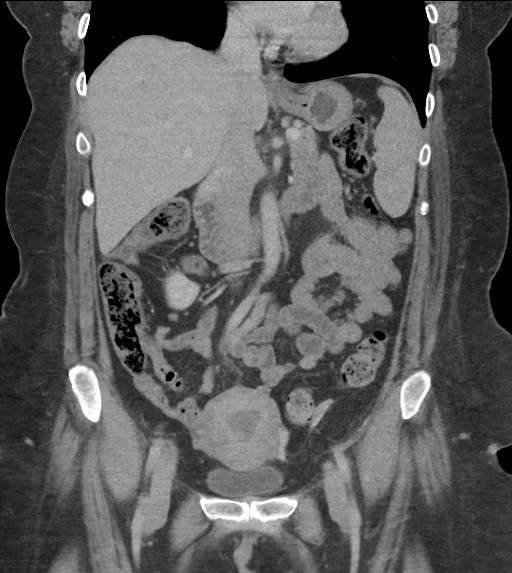
[im 55/99  soft-tissue]
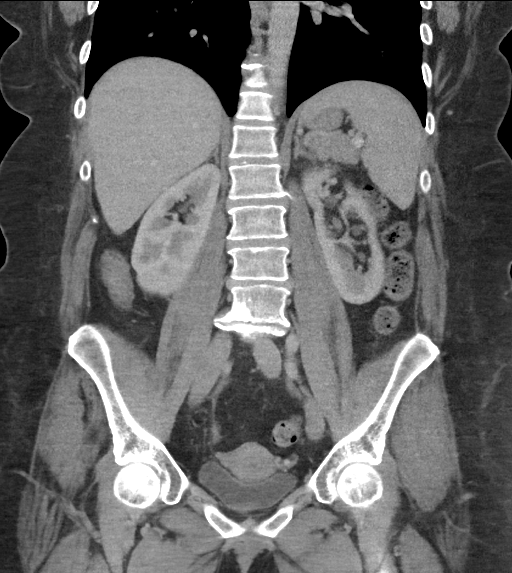

[16 of 46 positions shown; findings below may reference images not displayed]

FINDINGS: Lower chest: The visualized heart size within normal limits. No
pericardial fluid/thickening.

No hiatal hernia.

The visualized portions of the lungs are clear.

Hepatobiliary: The liver is normal in density without focal
abnormality.The main portal vein is patent. Layering calcified
gallstones are present.

Pancreas: Unremarkable. No pancreatic ductal dilatation or
surrounding inflammatory changes.

Spleen: Normal in size without focal abnormality.

Adrenals/Urinary Tract: Both adrenal glands appear normal. The
kidneys and collecting system appear normal without evidence of
urinary tract calculus or hydronephrosis. Bladder is unremarkable.

Stomach/Bowel: The stomach, small bowel, and colon are normal in
appearance. No inflammatory changes, wall thickening, or obstructive
findings.

Vascular/Lymphatic: There are no enlarged mesenteric,
retroperitoneal, or pelvic lymph nodes. No significant vascular
findings are present.

Reproductive: The uterus and adnexa are unremarkable.

Other: The small fat containing anterior umbilical hernia is
present.

Musculoskeletal: No acute or significant osseous findings.
IMPRESSION: Diverticulosis without evidence of diverticulitis.

Cholelithiasis

No other acute intra-abdominal or pelvic pathology to explain the
patient's symptoms.

## 2022-09-12 IMAGING — DX DG CHEST 1V PORT
1 series · 1 of 1 positions shown · non-contrast
Comparison: 12/31/2006

CLINICAL DATA: Crackles

EXAM:
PORTABLE CHEST 1 VIEW

[chest ap]
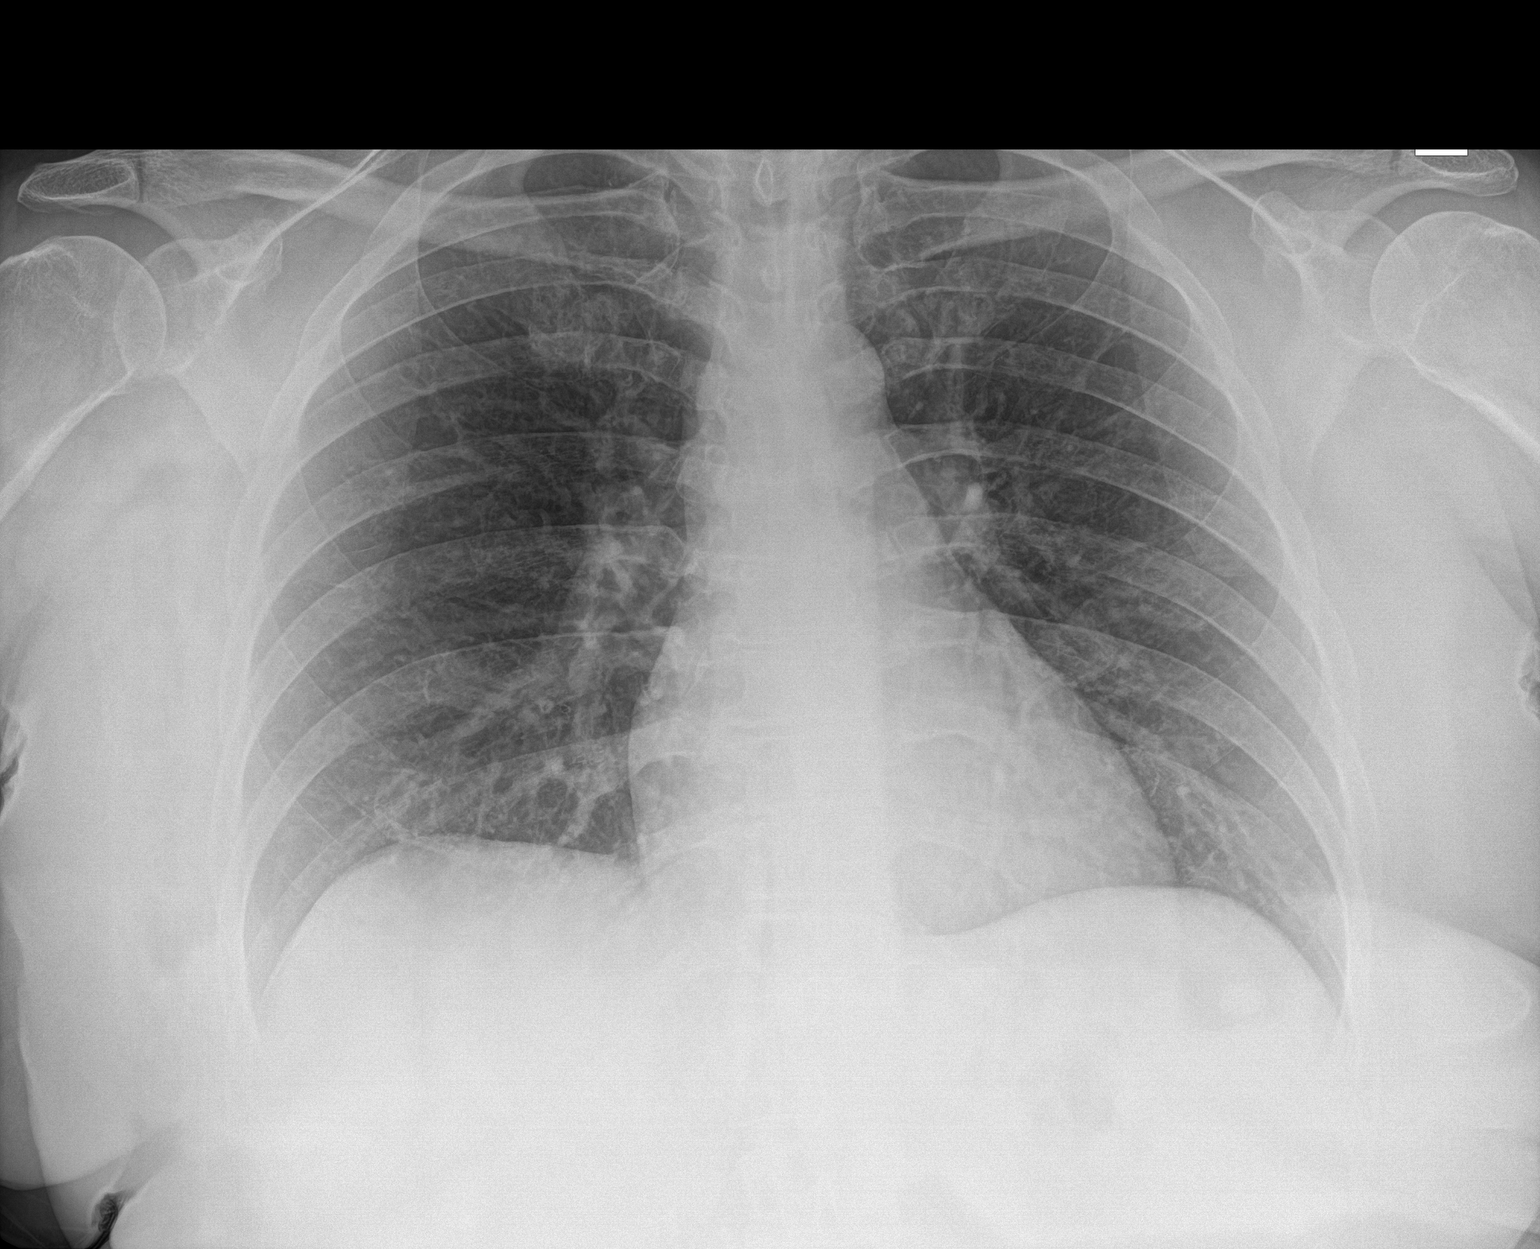

[1 of 1 positions shown; findings below may reference images not displayed]

FINDINGS: The heart size and mediastinal contours are within normal limits.
Both lungs are clear. The visualized skeletal structures are
unremarkable.
IMPRESSION: No active disease.

## 2022-12-30 ENCOUNTER — Other Ambulatory Visit: Payer: Self-pay | Admitting: Physician Assistant

## 2022-12-30 DIAGNOSIS — M479 Spondylosis, unspecified: Secondary | ICD-10-CM

## 2023-01-19 ENCOUNTER — Encounter: Payer: Self-pay | Admitting: Physician Assistant

## 2023-01-21 ENCOUNTER — Ambulatory Visit
Admission: RE | Admit: 2023-01-21 | Discharge: 2023-01-21 | Disposition: A | Discharge status: Home / Self Care | Payer: BC Managed Care – PPO | Source: Ambulatory Visit | Attending: Physician Assistant | Admitting: Physician Assistant

## 2023-01-21 DIAGNOSIS — M479 Spondylosis, unspecified: Secondary | ICD-10-CM

## 2023-05-08 HISTORY — PX: REPLACEMENT TOTAL KNEE: SUR1224

## 2023-12-22 NOTE — Progress Notes (Signed)
 Office Visit Note  Patient: Kaitlin Cole             Date of Birth: [birth year]           MRN: [id number]             PCP: Lynwood Laneta ORN, PA-C Referring: Leila Lucie LABOR, MD Visit Date: 01/05/2024 Occupation: Data Unavailable  Subjective:  Pain in multiple joints and muscles  History of Present Illness: Kaitlin Cole is a 48 y.o. female seen for the evaluation of polyarthralgias and myalgias.  According the patient in 2018 she started noticing some discomfort in her knees and her lower back when she was climbing stairs.  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  She states towards the end of the day she felt very tired and could not continue working after 6 months she changed jobs.  In 2021 she had cholecystectomy followed by COVID-19 virus infection.  She states since then she has been experiencing increased fatigue, generalized achiness which she describes in her joints and her muscles.  She has tried meloxicam and diclofenac in the past.  Now she has been taking Celebrex.  She states difficult for her to function without Celebrex.  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  She also gives history of dysmenorrhea, diarrhea and constipation alternating and frequency of urination.  She states she stays cold all the times.  She denies any history of Raynaud's phenomenon, malar rash, lymphadenopathy, photosensitivity, inflammatory arthritis, sicca symptoms or oral ulcers.  There is no family history of autoimmune disease.  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  She enjoys reading.  She walks for exercise and lift weights.  She is married, gravida 5, para 4, miscarriage 1..  There is no history of preeclampsia or DVTs.  She does not drink any alcohol and has never been a smoker.    Activities of Daily Living:  Patient reports morning stiffness for 1 hour.   Patient Reports nocturnal pain.   Difficulty dressing/grooming: Reports Difficulty climbing stairs: Reports Difficulty getting out of chair: Reports Difficulty using hands for taps, buttons, cutlery, and/or writing: Reports  Review of Systems  Constitutional:  Positive for fatigue.  HENT:  Positive for mouth sores and nose dryness. Negative for mouth dryness.   Eyes:  Negative for dryness.  Respiratory:  Positive for shortness of breath.   Cardiovascular:  Positive for palpitations. Negative for chest pain.  Gastrointestinal:  Positive for constipation and diarrhea. Negative for blood in stool.  Endocrine: Negative for increased urination.  Genitourinary:  Positive for painful urination. Negative for involuntary urination.  Musculoskeletal:  Positive for joint pain, gait problem, joint pain, joint swelling, myalgias, morning stiffness, muscle tenderness and myalgias. Negative for muscle weakness.  Skin:  Positive for hair loss. Negative for color change, rash and sensitivity to sunlight.  Allergic/Immunologic: Negative for susceptible to infections.  Neurological:  Positive for dizziness. Negative for headaches.  Hematological:  Negative for swollen glands.  Psychiatric/Behavioral:  Positive for sleep disturbance. Negative for depressed mood. The patient is not nervous/anxious.     PMFS History:  Patient Active Problem List   Diagnosis Date Noted   Calculus of gallbladder without cholecystitis without obstruction    Pain in joint of right hip 05/09/2019   Left foot pain 09/22/2018   Plantar fasciitis of right foot 09/22/2018   Class  3 severe obesity due to excess calories with body mass index (BMI) of 40.0 to 44.9 in adult [facility]) 03/16/2017   Pain in joint involving multiple sites 03/16/2017   H/O: cesarean section 10/02/2011   Abdominal pain 10/02/2011    Past Medical History:  Diagnosis Date   Arthritis    Headache    only centered around her cycle   No pertinent past medical history     Family History   Problem Relation Age of Onset   Heart disease Mother    Diabetes Mother    Healthy Brother    Healthy Son    Healthy Son    Diabetes Daughter        type 1- dx at age 88   Healthy Daughter    Other Neg Hx    Past Surgical History:  Procedure Laterality Date   CESAREAN SECTION     CHOLECYSTECTOMY N/A 10/30/2019   Procedure: LAPAROSCOPIC CHOLECYSTECTOMY;  Surgeon: Mavis Anes, MD;  Location: [facility];  Service: General;  Laterality: N/A;   KNEE ARTHROSCOPY W/ ACL RECONSTRUCTION  1991 and 1995   Both done at separate times   REPLACEMENT TOTAL KNEE Left 05/2023   TUBAL LIGATION     Social History   Tobacco Use   Smoking status: Never    Passive exposure: Current   Smokeless tobacco: Never  Vaping Use   Vaping status: Never Used  Substance Use Topics   Alcohol use: No   Drug use: No   Social History   Social History Narrative   Not on file     Immunization History  Administered Date(s) Administered   Influenza-Unspecified 03/02/2017   MMR 03/22/2017   PPD Test 04/17/2015, 04/17/2015, 03/16/2017   Tdap 03/02/2017   Varicella 03/22/2017     Objective: Vital Signs: BP 115/79 (BP Location: Right Arm, Patient Position: Sitting, Cuff Size: Large)   Pulse 66   Temp 97.6 F (36.4 C)   Resp 17   Ht 5' 4 (1.626 m)   Wt 223 lb 12.8 oz (101.5 kg)   BMI 38.42 kg/m    Physical Exam Vitals and nursing note reviewed.  Constitutional:      Appearance: She is well-developed.  HENT:     Head: Normocephalic and atraumatic.  Eyes:     Conjunctiva/sclera: Conjunctivae normal.  Cardiovascular:     Rate and Rhythm: Normal rate and regular rhythm.     Heart sounds: Normal heart sounds.  Pulmonary:     Effort: Pulmonary effort is normal.     Breath sounds: Normal breath sounds.  Abdominal:     General: Bowel sounds are normal.     Palpations: Abdomen is soft.  Musculoskeletal:     Cervical back: Normal range of motion.  Lymphadenopathy:     Cervical: No cervical  adenopathy.  Skin:    General: Skin is warm and dry.     Capillary Refill: Capillary refill takes less than 2 seconds.  Neurological:     Mental Status: She is alert and oriented to person, place, and time.  Psychiatric:        Behavior: Behavior normal.      Musculoskeletal Exam: She has limited lateral rotation of the cervical spine with discomfort.  There was no tenderness on the palpation of the thoracic or lumbar spine.  Shoulders, elbows, wrist joints, MCPs PIPs and DIPs with good range of motion.  She had bilateral PIP and DIP thickening.  No synovitis was noted.  Hip joints were  in good range of motion.  Knee joints with good range of motion.  Left knee joint was replaced.  There was no tenderness over her ankles.  She had bilateral dorsal spurs.  She had bilateral PIP and DIP thickening.  No synovitis was noted.  She had positive tender points.  CDAI Exam: CDAI Score: -- Patient Global: --; Provider Global: -- Swollen: --; Tender: -- Joint Exam 01/05/2024   No joint exam has been documented for this visit   There is currently no information documented on the homunculus. Go to the Rheumatology activity and complete the homunculus joint exam.  Investigation: No additional findings.  Imaging: No results found.  Recent Labs: Lab Results  Component Value Date   WBC 4.9 07/21/2021   HGB 12.4 07/21/2021   PLT 200 07/21/2021   NA 141 07/21/2021   K 3.9 07/21/2021   CL 111 07/21/2021   CO2 26 07/21/2021   GLUCOSE 91 07/21/2021   BUN 8 07/21/2021   CREATININE 0.60 07/21/2021   BILITOT 0.4 07/21/2021   ALKPHOS 71 07/21/2021   AST 14 (L) 07/21/2021   ALT 14 07/21/2021   PROT 7.0 07/21/2021   ALBUMIN 3.9 07/21/2021   CALCIUM 8.6 (L) 07/21/2021   GFRAA >60 12/08/2019    Speciality Comments: No specialty comments available.  Procedures:  No procedures performed Allergies: Penicillins   Assessment / Plan:     Visit Diagnoses: Polyarthralgia -she complains of pain  and discomfort in multiple joints since 2018.  Her symptoms got worse after COVID-19 virus infection in 2021.  2022: ANA-, RF-, Anti-CCP-. ESR and CRP mild elevated.  I will recheck autoimmune labs today.  She gives history of fatigue, arthralgias, myalgias, hair loss.  There is no history of oral ulcers, nasal ulcers, inflammatory arthritis, Raynaud's, photosensitivity or lymphadenopathy.  No synovitis was noted on the examination.  DDD (degenerative disc disease), cervical-patient states she had recent x-rays of the cervical spine which showed C5 and C6 degenerative changes.  She had discomfort range of motion of the cervical spine.  Lumbar spondylosis -she continues to have discomfort in her lumbar spine.  DDD with lumbar spinal stenosis. [facility] PT. SI joints x-rays on 06/11/22 were unremarkable.  Pain in both hands -she complains of discomfort in the bilateral hands.  No synovitis was noted.  PIP and DIP thickening was noted.  Plan: XR Hand 2 View Right, XR Hand 2 View Left.  X-rays of bilateral hands were suggestive of osteoarthritis.  Joint protection was discussed.  A handout on hand exercises was given.  Primary osteoarthritis of right knee-patient states she has moderate osteoarthritis in her right knee joint and is followed at [facility].  Pain in right hip - May 11, 2017 x-ray showed mild osteoarthritis.  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 Beverley.  Doing well.  Primary osteoarthritis of both feet -she complains of discomfort in her bilateral feet.  No synovitis was noted.  Dorsal spurs and PIP and DIP thickening was noted.  Plan: XR Foot 2 Views Right, XR Foot 2 Views Left.  X-rays of bilateral feet were suggestive of osteoarthritis.  Plantar fasciitis of right foot-she gives history of frequent plantar fasciitis.  She has been using proper fitting shoes which helped.  Myalgia -she gives history of generalized achiness of all the  muscles.  No muscular weakness was noted on the examination.  Plan: CK.  She gives history of hyperalgesia, fatigue, flulike symptoms.  Possibility of  fibromyalgia syndrome was discussed.  Detailed counseling on fibromyalgia syndrome was provided.  Benefits of Cymbalta was discussed.  Need for regular exercise, water aerobics and stretching was discussed.  Other fatigue -she gives history of increased fatigue since she has COVID-19 virus infection in 2019.  I will obtain following labs to evaluate further.  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  She has tried over-the-counter products without much help.  Other constipation-she gives history of constipation alternating with diarrhea.  She may have IBS.  Advise discussing with her PCP.  She may need GI evaluation.  Frequency of urination-because history of frequency of urination.  Possibility of interstitial cystitis was discussed.  Dysmenorrhea-patient gives history of dysmenorrhea.  Calculus of gallbladder without cholecystitis without obstruction  Seasonal allergies  BMI 38.0-38.9,adult-she is on Zepbound and is noticing weight loss.  Vitamin D deficiency -will check vitamin D level today.  Plan: VITAMIN D 25 Hydroxy (Vit-D Deficiency, Fractures)  Orders: Orders Placed This Encounter  Procedures   XR Hand 2 View Right   XR Hand 2 View Left   XR Foot 2 Views Right   XR Foot 2 Views Left   CBC with Differential/Platelet   Comprehensive metabolic panel with GFR   CK   Thyroid Panel With TSH   Serum protein electrophoresis with reflex   VITAMIN D 25 Hydroxy (Vit-D Deficiency, Fractures)   Iron, TIBC and Ferritin Panel   No orders of the defined types were placed in this encounter.    Follow-Up Instructions: Return for  Polyarthralgia, myalgia.   Maya Nash, MD  Note - This record has been created using Animal nutritionist.  Chart creation errors have been sought, but may not always  have been located. Such creation errors do not reflect on  the standard of medical care.

## 2024-01-05 ENCOUNTER — Ambulatory Visit

## 2024-01-05 ENCOUNTER — Ambulatory Visit: Attending: Rheumatology | Admitting: Rheumatology

## 2024-01-05 ENCOUNTER — Encounter: Payer: Self-pay | Admitting: Rheumatology

## 2024-01-05 VITALS — BP 115/79 | HR 66 | Temp 97.6°F | Resp 17 | Ht 64.0 in | Wt 223.8 lb

## 2024-01-05 DIAGNOSIS — E559 Vitamin D deficiency, unspecified: Secondary | ICD-10-CM

## 2024-01-05 DIAGNOSIS — K5909 Other constipation: Secondary | ICD-10-CM

## 2024-01-05 DIAGNOSIS — M2550 Pain in unspecified joint: Secondary | ICD-10-CM

## 2024-01-05 DIAGNOSIS — M19071 Primary osteoarthritis, right ankle and foot: Secondary | ICD-10-CM | POA: Diagnosis not present

## 2024-01-05 DIAGNOSIS — M19072 Primary osteoarthritis, left ankle and foot: Secondary | ICD-10-CM

## 2024-01-05 DIAGNOSIS — R350 Frequency of micturition: Secondary | ICD-10-CM

## 2024-01-05 DIAGNOSIS — M1711 Unilateral primary osteoarthritis, right knee: Secondary | ICD-10-CM

## 2024-01-05 DIAGNOSIS — N946 Dysmenorrhea, unspecified: Secondary | ICD-10-CM

## 2024-01-05 DIAGNOSIS — M25551 Pain in right hip: Secondary | ICD-10-CM

## 2024-01-05 DIAGNOSIS — G8929 Other chronic pain: Secondary | ICD-10-CM

## 2024-01-05 DIAGNOSIS — R5383 Other fatigue: Secondary | ICD-10-CM

## 2024-01-05 DIAGNOSIS — M79641 Pain in right hand: Secondary | ICD-10-CM | POA: Diagnosis not present

## 2024-01-05 DIAGNOSIS — M5030 Other cervical disc degeneration, unspecified cervical region: Secondary | ICD-10-CM

## 2024-01-05 DIAGNOSIS — Z96652 Presence of left artificial knee joint: Secondary | ICD-10-CM

## 2024-01-05 DIAGNOSIS — M47816 Spondylosis without myelopathy or radiculopathy, lumbar region: Secondary | ICD-10-CM | POA: Diagnosis not present

## 2024-01-05 DIAGNOSIS — M791 Myalgia, unspecified site: Secondary | ICD-10-CM

## 2024-01-05 DIAGNOSIS — M79642 Pain in left hand: Secondary | ICD-10-CM

## 2024-01-05 DIAGNOSIS — F5101 Primary insomnia: Secondary | ICD-10-CM

## 2024-01-05 DIAGNOSIS — M722 Plantar fascial fibromatosis: Secondary | ICD-10-CM

## 2024-01-05 DIAGNOSIS — K8020 Calculus of gallbladder without cholecystitis without obstruction: Secondary | ICD-10-CM

## 2024-01-05 DIAGNOSIS — J302 Other seasonal allergic rhinitis: Secondary | ICD-10-CM

## 2024-01-05 DIAGNOSIS — Z98891 History of uterine scar from previous surgery: Secondary | ICD-10-CM

## 2024-01-05 DIAGNOSIS — Z6838 Body mass index (BMI) 38.0-38.9, adult: Secondary | ICD-10-CM

## 2024-01-05 NOTE — Patient Instructions (Signed)

## 2024-01-07 ENCOUNTER — Ambulatory Visit: Payer: Self-pay | Admitting: Rheumatology

## 2024-01-07 LAB — IRON,TIBC AND FERRITIN PANEL
%SAT: 18 % (ref 16–45)
Ferritin: 43 ng/mL (ref 16–232)
Iron: 59 ug/dL (ref 40–190)
TIBC: 336 ug/dL (ref 250–450)

## 2024-01-07 LAB — CBC WITH DIFFERENTIAL/PLATELET
Absolute Lymphocytes: 2234 {cells}/uL (ref 850–3900)
Absolute Monocytes: 502 {cells}/uL (ref 200–950)
Basophils Absolute: 68 {cells}/uL (ref 0–200)
Basophils Relative: 1.2 %
Eosinophils Absolute: 485 {cells}/uL (ref 15–500)
Eosinophils Relative: 8.5 %
HCT: 40.2 % (ref 35.0–45.0)
Hemoglobin: 13.1 g/dL (ref 11.7–15.5)
MCH: 28.7 pg (ref 27.0–33.0)
MCHC: 32.6 g/dL (ref 32.0–36.0)
MCV: 88.2 fL (ref 80.0–100.0)
MPV: 9.8 fL (ref 7.5–12.5)
Monocytes Relative: 8.8 %
Neutro Abs: 2411 {cells}/uL (ref 1500–7800)
Neutrophils Relative %: 42.3 %
Platelets: 231 Thousand/uL (ref 140–400)
RBC: 4.56 Million/uL (ref 3.80–5.10)
RDW: 12.2 % (ref 11.0–15.0)
Total Lymphocyte: 39.2 %
WBC: 5.7 Thousand/uL (ref 3.8–10.8)

## 2024-01-07 LAB — VITAMIN D 25 HYDROXY (VIT D DEFICIENCY, FRACTURES): Vit D, 25-Hydroxy: 41 ng/mL (ref 30–100)

## 2024-01-07 LAB — COMPREHENSIVE METABOLIC PANEL WITH GFR
AG Ratio: 1.7 (calc) (ref 1.0–2.5)
ALT: 13 U/L (ref 6–29)
AST: 16 U/L (ref 10–35)
Albumin: 4.3 g/dL (ref 3.6–5.1)
Alkaline phosphatase (APISO): 77 U/L (ref 31–125)
BUN: 11 mg/dL (ref 7–25)
CO2: 31 mmol/L (ref 20–32)
Calcium: 9.3 mg/dL (ref 8.6–10.2)
Chloride: 105 mmol/L (ref 98–110)
Creat: 0.81 mg/dL (ref 0.50–0.99)
Globulin: 2.5 g/dL (ref 1.9–3.7)
Glucose, Bld: 76 mg/dL (ref 65–99)
Potassium: 4 mmol/L (ref 3.5–5.3)
Sodium: 142 mmol/L (ref 135–146)
Total Bilirubin: 0.3 mg/dL (ref 0.2–1.2)
Total Protein: 6.8 g/dL (ref 6.1–8.1)
eGFR: 89 mL/min/1.73m2 (ref >=60)

## 2024-01-07 LAB — PROTEIN ELECTROPHORESIS, SERUM, WITH REFLEX
Albumin ELP: 4.1 g/dL (ref 3.8–4.8)
Alpha 1: 0.2 g/dL (ref 0.2–0.3)
Alpha 2: 0.8 g/dL (ref 0.5–0.9)
Beta 2: 0.4 g/dL (ref 0.2–0.5)
Beta Globulin: 0.5 g/dL (ref 0.4–0.6)
Gamma Globulin: 0.9 g/dL (ref 0.8–1.7)
Total Protein: 7 g/dL (ref 6.1–8.1)

## 2024-01-07 LAB — THYROID PANEL WITH TSH
Free Thyroxine Index: 2.8 (ref 1.4–3.8)
T3 Uptake: 32 % (ref 22–35)
T4, Total: 8.7 ug/dL (ref 5.1–11.9)
TSH: 1.56 m[IU]/L

## 2024-01-07 LAB — CK: Total CK: 44 U/L (ref 20–239)

## 2024-01-07 LAB — RHEUMATOID FACTOR: Rheumatoid fact SerPl-aCnc: <10 [IU]/mL (ref <14)

## 2024-01-07 LAB — ANA: Anti Nuclear Antibody (ANA): NEGATIVE

## 2024-01-07 LAB — CYCLIC CITRUL PEPTIDE ANTIBODY, IGG: Cyclic Citrullin Peptide Ab: <16 U

## 2024-01-07 LAB — SEDIMENTATION RATE: Sed Rate: 14 mm/h (ref 0–20)

## 2024-01-07 NOTE — Progress Notes (Signed)
 CBC and CMP normal, CK normal, thyroid panel normal, vitamin D normal, iron studies normal, sed rate normal, ANA negative, anti-CCP negative, rheumatoid factor negative.  I will discuss results at the follow-up visit.

## 2024-01-09 NOTE — Progress Notes (Signed)
 CBC and CMP normal, CK normal,SPEP normal, vitamin D normal, sed rate normal, ANA negative, anti-CCP negative, RF negative, thyroid panel normal, iron studies normal.  I will discuss results at the follow-up visit.

## 2024-01-27 DIAGNOSIS — K5909 Other constipation: Secondary | ICD-10-CM | POA: Insufficient documentation

## 2024-01-27 DIAGNOSIS — M1711 Unilateral primary osteoarthritis, right knee: Secondary | ICD-10-CM | POA: Insufficient documentation

## 2024-01-27 DIAGNOSIS — M47816 Spondylosis without myelopathy or radiculopathy, lumbar region: Secondary | ICD-10-CM | POA: Insufficient documentation

## 2024-01-27 DIAGNOSIS — M19072 Primary osteoarthritis, left ankle and foot: Secondary | ICD-10-CM | POA: Insufficient documentation

## 2024-01-27 DIAGNOSIS — M5030 Other cervical disc degeneration, unspecified cervical region: Secondary | ICD-10-CM | POA: Insufficient documentation

## 2024-01-27 DIAGNOSIS — F5101 Primary insomnia: Secondary | ICD-10-CM | POA: Insufficient documentation

## 2024-01-27 DIAGNOSIS — M19041 Primary osteoarthritis, right hand: Secondary | ICD-10-CM | POA: Insufficient documentation

## 2024-01-27 DIAGNOSIS — J302 Other seasonal allergic rhinitis: Secondary | ICD-10-CM | POA: Insufficient documentation

## 2024-01-27 NOTE — Progress Notes (Signed)
 Office Visit Note  Patient: Kaitlin Cole             Date of Birth: 1975-11-07           MRN: [id number]             PCP: Lynwood Laneta ORN, PA-C Referring: Leila Lucie LABOR, MD Visit Date: 02/10/2024 Occupation: Data Unavailable  Subjective:  Pain in multiple joints  History of Present Illness: Kaitlin Cole is a 48 y.o. female with polyarthralgia and myalgias.  She returns today after her last visit on January 05, 2024.  She states she continues to have generalized pain and discomfort.  She complains of discomfort in her neck, lower back, hands, knee joints and her feet.  She also has generalized achiness.  All of her muscles are sore.  She gives history of fatigue, sores in her mouth and her nose.  There is no history of Raynaud's, malar rash, photosensitivity, sicca symptoms, lymphadenopathy or inflammatory arthritis.    Activities of Daily Living:  Patient reports morning stiffness for 1-2 hours.   Patient Reports nocturnal pain.  Difficulty dressing/grooming: Denies Difficulty climbing stairs: Reports Difficulty getting out of chair: Denies Difficulty using hands for taps, buttons, cutlery, and/or writing: Denies  Review of Systems  Constitutional:  Positive for fatigue.  HENT:  Positive for mouth sores and nose dryness. Negative for mouth dryness.        Nose sores  Eyes:  Negative for dryness.  Respiratory:  Positive for shortness of breath.   Cardiovascular:  Positive for palpitations. Negative for chest pain.  Gastrointestinal:  Positive for constipation and diarrhea. Negative for blood in stool.  Endocrine: Positive for increased urination.  Genitourinary:  Positive for involuntary urination.  Musculoskeletal:  Positive for joint pain, joint pain, joint swelling, myalgias, muscle weakness, morning stiffness, muscle tenderness and myalgias. Negative for gait problem.  Skin:  Negative for color change, rash, hair loss and sensitivity to sunlight.   Allergic/Immunologic: Positive for susceptible to infections.  Neurological:  Positive for headaches. Negative for dizziness.  Hematological:  Negative for swollen glands.  Psychiatric/Behavioral:  Positive for depressed mood and sleep disturbance. The patient is nervous/anxious.     PMFS History:  Patient Active Problem List   Diagnosis Date Noted   Primary osteoarthritis of both hands 01/27/2024   DDD (degenerative disc disease), cervical 01/27/2024   Lumbar spondylosis with spinal stenosis 01/27/2024   Primary osteoarthritis of both feet 01/27/2024   Primary osteoarthritis of right knee 01/27/2024   Primary insomnia 01/27/2024   Other constipation 01/27/2024   Seasonal allergies 01/27/2024   Calculus of gallbladder without cholecystitis without obstruction    Pain in joint of right hip 05/09/2019   Left foot pain 09/22/2018   Plantar fasciitis of right foot 09/22/2018   Class 3 severe obesity due to excess calories with body mass index (BMI) of 40.0 to 44.9 in adult [facility]) 03/16/2017   Pain in joint involving multiple sites 03/16/2017   H/O: cesarean section 10/02/2011   Abdominal pain 10/02/2011    Past Medical History:  Diagnosis Date   Arthritis    Headache    only centered around her cycle   No pertinent past medical history     Family History  Problem Relation Age of Onset   Heart disease Mother    Diabetes Mother    Healthy Brother    Healthy Son    Healthy Son    Diabetes Daughter  type 1- dx at age 32   Healthy Daughter    Other Neg Hx    Past Surgical History:  Procedure Laterality Date   CESAREAN SECTION     CHOLECYSTECTOMY N/A 10/30/2019   Procedure: LAPAROSCOPIC CHOLECYSTECTOMY;  Surgeon: Mavis Anes, MD;  Location: [facility];  Service: General;  Laterality: N/A;   KNEE ARTHROSCOPY W/ ACL RECONSTRUCTION  1991 and 1995   Both done at separate times   REPLACEMENT TOTAL KNEE Left 05/2023   TUBAL LIGATION     Social History   Tobacco Use    Smoking status: Never    Passive exposure: Current   Smokeless tobacco: Never  Vaping Use   Vaping status: Never Used  Substance Use Topics   Alcohol use: No   Drug use: No   Social History   Social History Narrative   Not on file     Immunization History  Administered Date(s) Administered   Influenza-Unspecified 03/02/2017   MMR 03/22/2017   PPD Test 04/17/2015, 04/17/2015, 03/16/2017   Tdap 03/02/2017   Varicella 03/22/2017     Objective: Vital Signs: BP 137/83   Pulse 75   Temp (!) 97.3 F (36.3 C)   Resp 16   Ht 5' 4 (1.626 m)   Wt 221 lb 3.2 oz (100.3 kg)   BMI 37.97 kg/m    Physical Exam Vitals and nursing note reviewed.  Constitutional:      Appearance: She is well-developed.  HENT:     Head: Normocephalic and atraumatic.  Eyes:     Conjunctiva/sclera: Conjunctivae normal.  Cardiovascular:     Rate and Rhythm: Normal rate and regular rhythm.     Heart sounds: Normal heart sounds.  Pulmonary:     Effort: Pulmonary effort is normal.     Breath sounds: Normal breath sounds.  Abdominal:     General: Bowel sounds are normal.     Palpations: Abdomen is soft.  Musculoskeletal:     Cervical back: Normal range of motion.  Lymphadenopathy:     Cervical: No cervical adenopathy.  Skin:    General: Skin is warm and dry.     Capillary Refill: Capillary refill takes less than 2 seconds.  Neurological:     Mental Status: She is alert and oriented to person, place, and time.  Psychiatric:        Behavior: Behavior normal.      Musculoskeletal Exam: She had limited lateral rotation of the cervical spine with some discomfort.  She good range of motion of the lumbar spine.  She could range of motion of her shoulders elbows wrist, MCPs PIPs and DIPs.  PIP and DIP thickening with no synovitis was noted.  Hip joints in good range of motion.  Left knee joint was replaced and warm to touch.  Right knee joint was in good range of motion.  There was no tenderness over  ankles or MTPs.  She had bilateral dorsal spurs.  CDAI Exam: CDAI Score: -- Patient Global: --; Provider Global: -- Swollen: --; Tender: -- Joint Exam 02/10/2024   No joint exam has been documented for this visit   There is currently no information documented on the homunculus. Go to the Rheumatology activity and complete the homunculus joint exam.  Investigation: No additional findings.  Imaging: No results found.   Recent Labs: Lab Results  Component Value Date   WBC 5.7 01/05/2024   HGB 13.1 01/05/2024   PLT 231 01/05/2024   NA 142 01/05/2024   K  4.0 01/05/2024   CL 105 01/05/2024   CO2 31 01/05/2024   GLUCOSE 76 01/05/2024   BUN 11 01/05/2024   CREATININE 0.81 01/05/2024   BILITOT 0.3 01/05/2024   ALKPHOS 71 07/21/2021   AST 16 01/05/2024   ALT 13 01/05/2024   PROT 6.8 01/05/2024   PROT 7.0 01/05/2024   ALBUMIN 3.9 07/21/2021   CALCIUM 9.3 01/05/2024   GFRAA >60 12/08/2019   January 05, 2024 SPEP normal, thyroid  panel normal, iron studies normal, CK 44, vitamin D  41, sed rate 14, ANA negative, RF negative, anti-CCP negative  2022: ANA-, RF-, Anti-CCP-. ESR and CRP mild elevated.   Speciality Comments: No specialty comments available.  Procedures:  No procedures performed Allergies: Penicillins   Assessment / Plan:     Visit Diagnoses: Polyarthralgia - Pain and discomfort in multiple joints since 2018.  No synovitis noted on the examination.  Patient denies any history of oral ulcers, nasal ulcers, sicca symptoms, malar rash photosensitivity, Raynaud's, lymphadenopathy or inflammatory arthritis.  January 05, 2024 SPEP normal, thyroid  panel normal, iron studies normal, CK 44, vitamin D  41, sed rate 14, ANA negative, RF negative, anti-CCP negative  DDD (degenerative disc disease), cervical -she continues to have some stiffness in the cervical spine.  C5-C6 narrowing was noted on the previous x-rays.  A handout on neck exercises was given.  Lumbar spondylosis  with spinal stenosis-his chronic lower back pain.  Primary osteoarthritis of both hands -she complains of ongoing pain and discomfort in her bilateral hands.  Bilateral PIP and DIP thickening was noted.  X-rays obtained at the last visit were suggestive of osteoarthritis.  Findings were reviewed with the patient.  Joint protection muscle strengthening was discussed.  Detailed counsel regarding osteoarthritis were provided.  A handout on hand exercises was given.  Primary osteoarthritis of right knee - Moderate osteoarthritis.  She continues to have intermittent pain and discomfort.  A handout on lower extremity exercise was given.  Pain in right hip - Mild osteoarthritis noted May 11, 2017 on the x-rays.  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 Beverley  Primary osteoarthritis of both feet -she continues having stiffness in her feet.  Proper fitting shoes were advised.  X-rays obtained at the last visit were sensitive of osteoarthritis.  Plantar fasciitis of right foot-did not symptomatic.  Myalgia - Symptoms are suggestive of fibromyalgia syndrome.  Benefits of Cymbalta and exercises were discussed at the last visit.  Patient will discuss Cymbalta use with her PCP.  Other fatigue - Since  COVID-19 19 virus infection 2019.  Primary insomnia-sleep hygiene was discussed.  Frequency of urination - Stability of interstitial cystitis discussed.  Other constipation - Alternating with diarrhea.  Most likely IBS.  Dysmenorrhea  Calculus of gallbladder without cholecystitis without obstruction  Seasonal allergies  Vitamin D  deficiency  BMI 38.0-38.9,adult  Orders: No orders of the defined types were placed in this encounter.  No orders of the defined types were placed in this encounter.   Follow-Up Instructions: Return for Osteoarthritis.   Kaitlin Nash, MD  Note - This record has been created using Animal nutritionist.  Chart creation errors  have been sought, but may not always  have been located. Such creation errors do not reflect on  the standard of medical care.

## 2024-02-10 ENCOUNTER — Encounter: Payer: Self-pay | Admitting: Rheumatology

## 2024-02-10 ENCOUNTER — Ambulatory Visit: Attending: Rheumatology | Admitting: Rheumatology

## 2024-02-10 VITALS — BP 137/83 | HR 75 | Temp 97.3°F | Resp 16 | Ht 64.0 in | Wt 221.2 lb

## 2024-02-10 DIAGNOSIS — M19041 Primary osteoarthritis, right hand: Secondary | ICD-10-CM | POA: Diagnosis not present

## 2024-02-10 DIAGNOSIS — Z96652 Presence of left artificial knee joint: Secondary | ICD-10-CM

## 2024-02-10 DIAGNOSIS — M47816 Spondylosis without myelopathy or radiculopathy, lumbar region: Secondary | ICD-10-CM | POA: Diagnosis not present

## 2024-02-10 DIAGNOSIS — M5030 Other cervical disc degeneration, unspecified cervical region: Secondary | ICD-10-CM

## 2024-02-10 DIAGNOSIS — M19042 Primary osteoarthritis, left hand: Secondary | ICD-10-CM

## 2024-02-10 DIAGNOSIS — K8020 Calculus of gallbladder without cholecystitis without obstruction: Secondary | ICD-10-CM

## 2024-02-10 DIAGNOSIS — F5101 Primary insomnia: Secondary | ICD-10-CM

## 2024-02-10 DIAGNOSIS — R350 Frequency of micturition: Secondary | ICD-10-CM

## 2024-02-10 DIAGNOSIS — M791 Myalgia, unspecified site: Secondary | ICD-10-CM

## 2024-02-10 DIAGNOSIS — M19071 Primary osteoarthritis, right ankle and foot: Secondary | ICD-10-CM

## 2024-02-10 DIAGNOSIS — R5383 Other fatigue: Secondary | ICD-10-CM

## 2024-02-10 DIAGNOSIS — M1711 Unilateral primary osteoarthritis, right knee: Secondary | ICD-10-CM

## 2024-02-10 DIAGNOSIS — Z6838 Body mass index (BMI) 38.0-38.9, adult: Secondary | ICD-10-CM

## 2024-02-10 DIAGNOSIS — M19072 Primary osteoarthritis, left ankle and foot: Secondary | ICD-10-CM

## 2024-02-10 DIAGNOSIS — E559 Vitamin D deficiency, unspecified: Secondary | ICD-10-CM

## 2024-02-10 DIAGNOSIS — K5909 Other constipation: Secondary | ICD-10-CM

## 2024-02-10 DIAGNOSIS — M2550 Pain in unspecified joint: Secondary | ICD-10-CM

## 2024-02-10 DIAGNOSIS — J302 Other seasonal allergic rhinitis: Secondary | ICD-10-CM

## 2024-02-10 DIAGNOSIS — M722 Plantar fascial fibromatosis: Secondary | ICD-10-CM

## 2024-02-10 DIAGNOSIS — N946 Dysmenorrhea, unspecified: Secondary | ICD-10-CM

## 2024-02-10 DIAGNOSIS — M25551 Pain in right hip: Secondary | ICD-10-CM

## 2024-02-10 NOTE — Patient Instructions (Signed)
 Cervical Strain and Sprain Rehab Ask your health care provider which exercises are safe for you. Do exercises exactly as told by your health care provider and adjust them as directed. It is normal to feel mild stretching, pulling, tightness, or discomfort as you do these exercises. Stop right away if you feel sudden pain or your pain gets worse. Do not begin these exercises until told by your health care provider. Stretching and range-of-motion exercises Cervical side bending  Using good posture, sit on a stable chair or stand up. Without moving your shoulders, slowly tilt your left / right ear to your shoulder until you feel a stretch in the neck muscles on the opposite side. You should be looking straight ahead. Hold for __________ seconds. Repeat with the other side of your neck. Repeat __________ times. Complete this exercise __________ times a day. Cervical rotation  Using good posture, sit on a stable chair or stand up. Slowly turn your head to the side as if you are looking over your left / right shoulder. Keep your eyes level with the ground. Stop when you feel a stretch along the side and the back of your neck. Hold for __________ seconds. Repeat this by turning to your other side. Repeat __________ times. Complete this exercise __________ times a day. Thoracic extension and pectoral stretch  Roll a towel or a small blanket so it is about 4 inches (10 cm) in diameter. Lie down on your back on a firm surface. Put the towel in the middle of your back across your spine. It should not be under your shoulder blades. Put your hands behind your head and let your elbows fall out to your sides. Hold for __________ seconds. Repeat __________ times. Complete this exercise __________ times a day. Strengthening exercises Upper cervical flexion  Lie on your back with a thin pillow behind your head or a small, rolled-up towel under your neck. Gently tuck your chin toward your chest and nod  your head down to look toward your feet. Do not lift your head off the pillow. Hold for __________ seconds. Release the tension slowly. Relax your neck muscles completely before you repeat this exercise. Repeat __________ times. Complete this exercise __________ times a day. Cervical extension  Stand about 6 inches (15 cm) away from a wall, with your back facing the wall. Place a soft object, about 6-8 inches (15-20 cm) in diameter, between the back of your head and the wall. A soft object could be a small pillow, a ball, or a folded towel. Gently tilt your head back and press into the soft object. Keep your jaw and forehead relaxed. Hold for __________ seconds. Release the tension slowly. Relax your neck muscles completely before you repeat this exercise. Repeat __________ times. Complete this exercise __________ times a day. Posture and body mechanics Body mechanics refer to the movements and positions of your body while you do your daily activities. Posture is part of body mechanics. Good posture and healthy body mechanics can help to relieve stress in your body's tissues and joints. Good posture means that your spine is in its natural S-curve position (your spine is neutral), your shoulders are pulled back slightly, and your head is not tipped forward. The following are general guidelines for using improved posture and body mechanics in your everyday activities. Sitting  When sitting, keep your spine neutral and keep your feet flat on the floor. Use a footrest, if needed, and keep your thighs parallel to the floor. Avoid rounding  your shoulders. Avoid tilting your head forward. When working at a desk or a computer, keep your desk at a height where your hands are slightly lower than your elbows. Slide your chair under your desk so you are close enough to maintain good posture. When working at a computer, place your monitor at a height where you are looking straight ahead and you do not have to  tilt your head forward or downward to look at the screen. Standing  When standing, keep your spine neutral and keep your feet about hip-width apart. Keep a slight bend in your knees. Your ears, shoulders, and hips should line up. When you do a task in which you stand in one place for a long time, place one foot up on a stable object that is 2-4 inches (5-10 cm) high, such as a footstool. This helps keep your spine neutral. Resting When lying down and resting, avoid positions that are most painful for you. Try to support your neck in a neutral position. You can use a contour pillow or a small rolled-up towel. Your pillow should support your neck but not push on it. This information is not intended to replace advice given to you by your health care provider. Make sure you discuss any questions you have with your health care provider. Document Revised: 06/29/2022 Document Reviewed: 09/15/2021 Else[employer] Patient Education  2024 [employer]Exercises for Chronic Knee Pain Chronic knee pain is pain that lasts longer than 3 months. For most people with chronic knee pain, exercise and weight loss is an important part of treatment. Your health care provider may want you to focus on: Making the muscles that support your knee stronger. This can take pressure off your knee and reduce pain. Preventing knee stiffness. How far you can move your knee, keeping it there or making it farther. Losing weight (if this applies) to take pressure off your knee, lower your risk for injury, and make it easier for you to exercise. Your provider will help you make an exercise program that fits your needs and physical abilities. Below are simple, low-impact exercises you can do at home. Ask your provider or physical therapist how often you should do your exercise program and how many times to repeat each exercise. General safety tips  Get your provider's approval before doing any exercises. Start slowly and stop any time you  feel pain. Do not exercise if your knee pain is flaring up. Warm up first. Stretching a cold muscle can cause an injury. Do 5-10 minutes of easy movement or light stretching before beginning your exercises. Do 5-10 minutes of low-impact activity (like walking or cycling) before starting strengthening exercises. Contact your provider any time you have pain during or after exercising. Exercise can cause discomfort but should not be painful. It is normal to be a little stiff or sore after exercising. Stretching and range-of-motion exercises Front thigh stretch  Stand up straight and support your body by holding on to a chair or resting one hand on a wall. With your legs straight and close together, bend one knee to lift your heel up toward your butt. Using one hand for support, grab your ankle with your free hand. Pull your foot up closer toward your butt to feel the stretch in front of your thigh. Hold the stretch for 30 seconds. Repeat __________ times. Complete this exercise __________ times a day. Back thigh stretch  Sit on the floor with your back straight and your legs out straight in front  of you. Place the palms of your hands on the floor and slide them toward your feet as you bend at the hip. Try to touch your nose to your knees and feel the stretch in the back of your thighs. Hold for 30 seconds. Repeat __________ times. Complete this exercise __________ times a day. Calf stretch  Stand facing a wall. Place the palms of your hands flat against the wall, arms extended, and lean slightly against the wall. Get into a lunge position with one leg bent at the knee and the other leg stretched out straight behind you. Keep both feet facing the wall and increase the bend in your knee while keeping the heel of the other leg flat on the ground. You should feel the stretch in your calf. Hold for 30 seconds. Repeat __________ times. Complete this exercise __________ times a day. Strengthening  exercises Straight leg lift  Lie on your back with one knee bent and the other leg out straight. Slowly lift the straight leg without bending the knee. Lift until your foot is about 12 inches (30 cm) off the floor. Hold for 3-5 seconds and slowly lower your leg. Repeat __________ times. Complete this exercise __________ times a day. Single leg dip  Stand between two chairs and put both hands on the backs of the chairs for support. Extend one leg out straight with your body weight resting on the heel of the standing leg. Slowly bend your standing knee to dip your body to the level that is comfortable for you. Hold for 3-5 seconds. Repeat __________ times. Complete this exercise __________ times a day. Hamstring curls  Stand straight, knees close together, facing the back of a chair. Hold on to the back of a chair with both hands. Keep one leg straight. Bend the other knee while bringing the heel up toward the butt until the knee is bent at a 90-degree angle (right angle). Hold for 3-5 seconds. Repeat __________ times. Complete this exercise __________ times a day. Wall squat  Stand straight with your back, hips, and head against a wall. Step forward one foot at a time with your back still against the wall. Your feet should be 2 feet (61 cm) from the wall at shoulder width. Keeping your back, hips, and head against the wall, slide down the wall to as close to a sitting position as you can get. Hold for 5-10 seconds, then slowly slide back up. Repeat __________ times. Complete this exercise __________ times a day. Step-ups  Stand in front of a sturdy platform or stool that is about 6 inches (15 cm) high. Slowly step up with your left / right foot, keeping your knee in line with your hip and foot. Do not let your knee bend so far that you cannot see your toes. Hold on to a chair for balance, but do not use it for support. Slowly unlock your knee and lower yourself to the starting  position. Repeat __________ times. Complete this exercise __________ times a day. Contact a health care provider if: Your exercises cause pain. Your pain is worse after you exercise. Your pain prevents you from doing your exercises. This information is not intended to replace advice given to you by your health care provider. Make sure you discuss any questions you have with your health care provider. Document Revised: 03/10/2022 Document Reviewed: 03/10/2022 Else[employer] Patient Education  2024 [employer]Hand Exercises Hand exercises can be helpful for almost anyone. They can strengthen your hands and improve  flexibility and movement. The exercises can also increase blood flow to the hands. These results can make your work and daily tasks easier for you. Hand exercises can be especially helpful for people who have joint pain from arthritis or nerve damage from using their hands over and over. These exercises can also help people who injure a hand. Exercises Most of these hand exercises are gentle stretching and motion exercises. It is usually safe to do them often throughout the day. Warming up your hands before exercise may help reduce stiffness. You can do this with gentle massage or by placing your hands in warm water for 10-15 minutes. It is normal to feel some stretching, pulling, tightness, or mild discomfort when you begin new exercises. In time, this will improve. Remember to always be careful and stop right away if you feel sudden, very bad pain or your pain gets worse. You want to get better and be safe. Ask your health care provider which exercises are safe for you. Do exercises exactly as told by your provider and adjust them as told. Do not begin these exercises until told by your provider. Knuckle bend or claw fist  Stand or sit with your arm, hand, and all five fingers pointed straight up. Make sure to keep your wrist straight. Gently bend your fingers down toward your palm until  the tips of your fingers are touching your palm. Keep your big knuckle straight and only bend the small knuckles in your fingers. Hold this position for 10 seconds. Straighten your fingers back to your starting position. Repeat this exercise 5-10 times with each hand. Full finger fist  Stand or sit with your arm, hand, and all five fingers pointed straight up. Make sure to keep your wrist straight. Gently bend your fingers into your palm until the tips of your fingers are touching the middle of your palm. Hold this position for 10 seconds. Extend your fingers back to your starting position, stretching every joint fully. Repeat this exercise 5-10 times with each hand. Straight fist  Stand or sit with your arm, hand, and all five fingers pointed straight up. Make sure to keep your wrist straight. Gently bend your fingers at the big knuckle, where your fingers meet your hand, and at the middle knuckle. Keep the knuckle at the tips of your fingers straight and try to touch the bottom of your palm. Hold this position for 10 seconds. Extend your fingers back to your starting position, stretching every joint fully. Repeat this exercise 5-10 times with each hand. Tabletop  Stand or sit with your arm, hand, and all five fingers pointed straight up. Make sure to keep your wrist straight. Gently bend your fingers at the big knuckle, where your fingers meet your hand, as far down as you can. Keep the small knuckles in your fingers straight. Think of forming a tabletop with your fingers. Hold this position for 10 seconds. Extend your fingers back to your starting position, stretching every joint fully. Repeat this exercise 5-10 times with each hand. Finger spread  Place your hand flat on a table with your palm facing down. Make sure your wrist stays straight. Spread your fingers and thumb apart from each other as far as you can until you feel a gentle stretch. Hold this position for 10 seconds. Bring  your fingers and thumb tight together again. Hold this position for 10 seconds. Repeat this exercise 5-10 times with each hand. Making circles  Stand or sit with your arm,  hand, and all five fingers pointed straight up. Make sure to keep your wrist straight. Make a circle by touching the tip of your thumb to the tip of your index finger. Hold for 10 seconds. Then open your hand wide. Repeat this motion with your thumb and each of your fingers. Repeat this exercise 5-10 times with each hand. Thumb motion  Sit with your forearm resting on a table and your wrist straight. Your thumb should be facing up toward the ceiling. Keep your fingers relaxed as you move your thumb. Lift your thumb up as high as you can toward the ceiling. Hold for 10 seconds. Bend your thumb across your palm as far as you can, reaching the tip of your thumb for the small finger (pinkie) side of your palm. Hold for 10 seconds. Repeat this exercise 5-10 times with each hand. Grip strengthening  Hold a stress ball or other soft ball in the middle of your hand. Slowly increase the pressure, squeezing the ball as much as you can without causing pain. Think of bringing the tips of your fingers into the middle of your palm. All of your finger joints should bend when doing this exercise. Hold your squeeze for 10 seconds, then relax. Repeat this exercise 5-10 times with each hand. Contact a health care provider if: Your hand pain or discomfort gets much worse when you do an exercise. Your hand pain or discomfort does not improve within 2 hours after you exercise. If you have either of these problems, stop doing these exercises right away. Do not do them again unless your provider says that you can. Get help right away if: You develop sudden, severe hand pain or swelling. If this happens, stop doing these exercises right away. Do not do them again unless your provider says that you can. This information is not intended to replace  advice given to you by your health care provider. Make sure you discuss any questions you have with your health care provider. Document Revised: 03/10/2022 Document Reviewed: 03/10/2022 Elsevier Patient Education  2024 [employer]Osteoarthritis  Osteoarthritis is a type of arthritis. It refers to joint pain or joint disease. Osteoarthritis affects tissue that covers the ends of bones in joints (cartilage). Cartilage acts as a cushion between the bones and helps them move smoothly. Osteoarthritis occurs when cartilage in the joints gets worn down. Osteoarthritis is sometimes called wear and tear arthritis. Osteoarthritis is the most common form of arthritis. It often occurs in older people. It is a condition that gets worse over time. The joints most often affected by this condition are in the fingers, toes, hips, knees, and spine, including the neck and lower back. What are the causes? This condition is caused by the wearing down of cartilage that covers the ends of bones. What increases the risk? The following factors may make you more likely to develop this condition: Being age 72 or older. Obesity. Overuse of joints. Past injury of a joint. Past surgery on a joint. Family history of osteoarthritis. What are the signs or symptoms? The main symptoms of this condition are pain, swelling, and stiffness in the joint. Other symptoms may include: An enlarged joint. More pain and further damage caused by small pieces of bone or cartilage that break off and float inside of the joint. Small deposits of bone (osteophytes) that grow on the edges of the joint. A grating or scraping feeling inside the joint when you move it. Popping or creaking sounds when you move. Difficulty  walking or exercising. An inability to grip items, twist your hand, or control the movements of your hands and fingers. How is this diagnosed? This condition may be diagnosed based on: Your medical history. A physical  exam. Your symptoms. X-rays of the affected joints. Blood tests to rule out other types of arthritis. How is this treated? There is no cure for this condition, but treatment can help control pain and improve joint function. Treatment may include a combination of therapies, such as: Pain relief techniques, such as: Applying heat and cold to the joint. Massage. A form of talk therapy called cognitive behavioral therapy (CBT). This therapy helps you set goals and follow up on the changes that you make. Medicines for pain and inflammation. The medicines can be taken by mouth or applied to the skin. They include: NSAIDs, such as ibuprofen. Prescription medicines. Strong anti-inflammatory medicines (corticosteroids). Certain nutritional supplements. A prescribed exercise program. You may work with a physical therapist. Assistive devices, such as a brace, wrap, splint, specialized glove, or cane. A weight control plan. Surgery, such as: An osteotomy. This is done to reposition the bones and relieve pain or to remove loose pieces of bone and cartilage. Joint replacement surgery. You may need this surgery if you have advanced osteoarthritis. Follow these instructions at home: Activity Rest your affected joints as told by your health care provider. Exercise as told by your provider. The provider may recommend specific types of exercise, such as: Strengthening exercises. These are done to strengthen the muscles that support joints affected by arthritis. Aerobic activities. These are exercises, such as brisk walking or water aerobics, that increase your heart rate. Range-of-motion activities. These help your joints move more easily. Balance and agility exercises. Managing pain, stiffness, and swelling     If told, apply heat to the affected area as often as told by your provider. Use the heat source that your provider recommends, such as a moist heat pack or a heating pad. If you have a  removable assistive device, remove it as told by your provider. Place a towel between your skin and the heat source. If your provider tells you to keep the assistive device on while you apply heat, place a towel between the assistive device and the heat source. Leave the heat on for 20-30 minutes. If told, put ice on the affected area. If you have a removable assistive device, remove it as told by your provider. Put ice in a plastic bag. Place a towel between your skin and the bag. If your provider tells you to keep the assistive device on during icing, place a towel between the assistive device and the bag. Leave the ice on for 20 minutes, 2-3 times a day. If your skin turns bright red, remove the ice or heat right away to prevent skin damage. The risk of damage is higher if you cannot feel pain, heat, or cold. Move your fingers or toes often to reduce stiffness and swelling. Raise (elevate) the affected area above the level of your heart while you are sitting or lying down. General instructions Take over-the-counter and prescription medicines only as told by your provider. Maintain a healthy weight. Follow instructions from your provider for weight control. Do not use any products that contain nicotine or tobacco. These products include cigarettes, chewing tobacco, and vaping devices, such as e-cigarettes. If you need help quitting, ask your provider. Use assistive devices as told by your provider. Where to find more information [employer] of Arthritis and  Musculoskeletal and Skin Diseases: niams.http://www.myers.net/ [employer] on Aging: baseringtones.pl American College of Rheumatology: rheumatology.org Contact a health care provider if: You have redness, swelling, or a feeling of warmth in a joint that gets worse. You have a fever along with joint or muscle aches. You develop a rash. You have trouble doing your normal activities. You have pain that gets worse and is not relieved by pain  medicine. This information is not intended to replace advice given to you by your health care provider. Make sure you discuss any questions you have with your health care provider. Document Revised: 10/23/2021 Document Reviewed: 10/23/2021 Elsevier Patient Education  2024 [employer].

## 2024-04-25 IMAGING — CT CT ABD-PELV W/ CM
2 of 5 series · 15 of 46 positions shown, 17 images · IV contrast (agent unspecified)
Comparison: CT abdomen and pelvis 09/27/2019

CLINICAL DATA: Acute abdominal pain.

EXAM:
CT ABDOMEN AND PELVIS WITH CONTRAST
TECHNIQUE: Multidetector CT imaging of the abdomen and pelvis was performed
using the standard protocol following bolus administration of
intravenous contrast.

[Series 2: axial st · axial · 0.76mm/px · z∈[+900,+1260]mm · 12 of 86 slices shown, 14 images]
[im 7/86  soft-tissue]
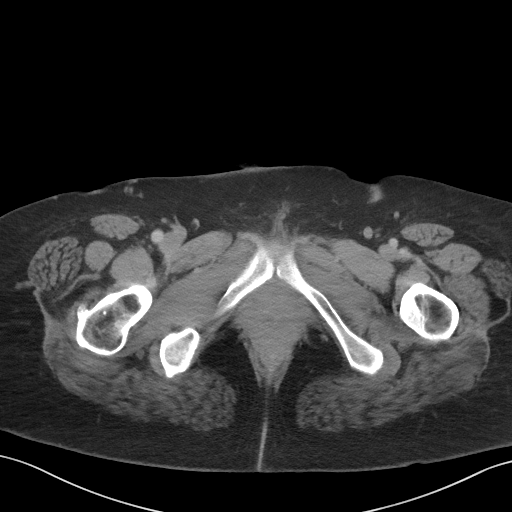
[im 7/86  bone]
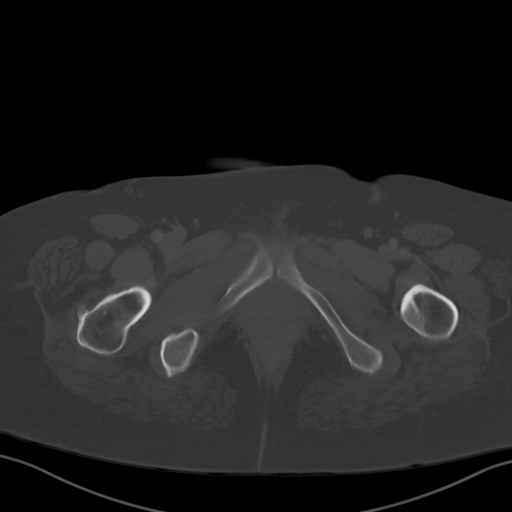
[im 14/86  soft-tissue]
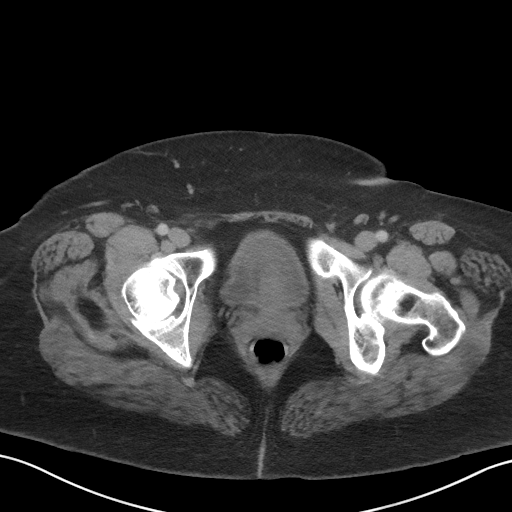
[im 20/86  soft-tissue]
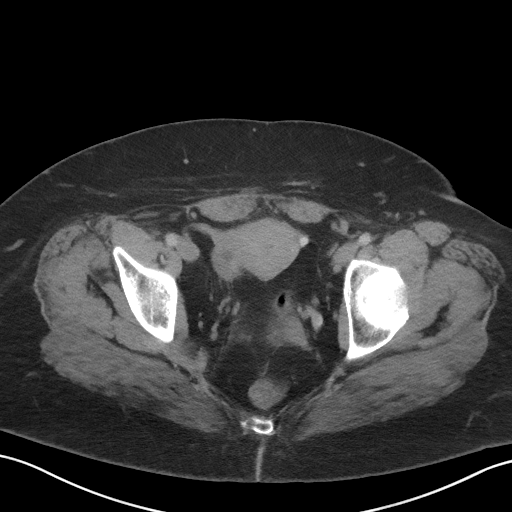
[im 27/86  soft-tissue]
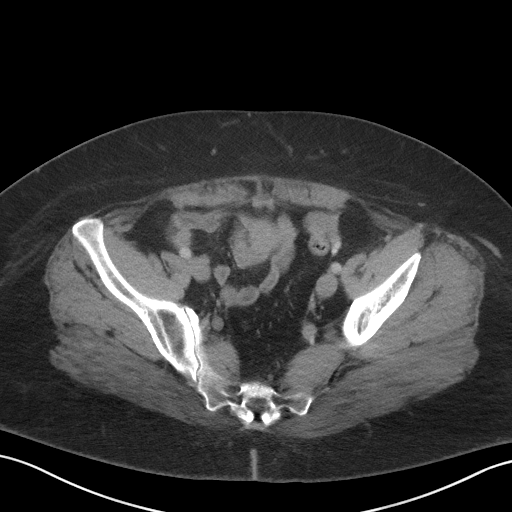
[im 33/86  soft-tissue]
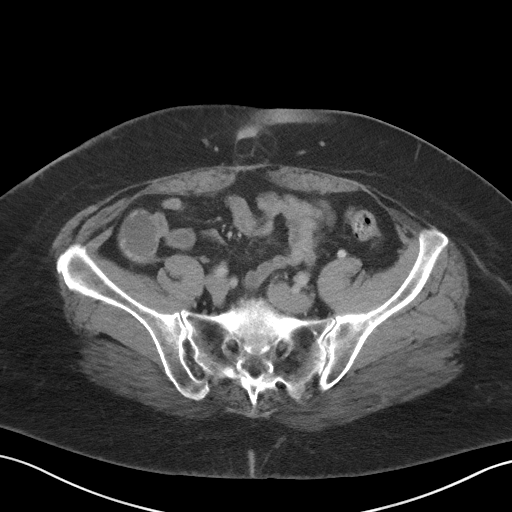
[im 40/86  soft-tissue]
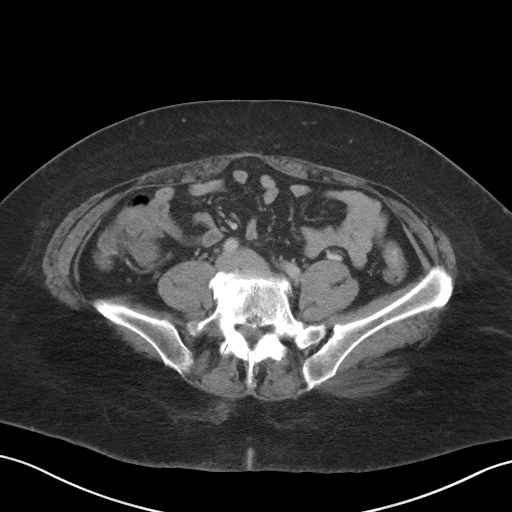
[im 46/86  soft-tissue]
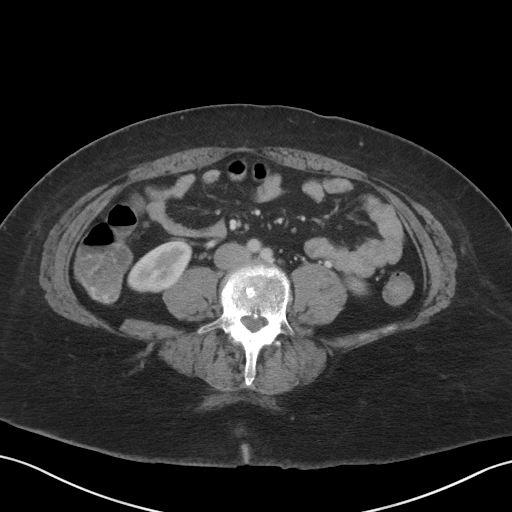
[im 53/86  soft-tissue]
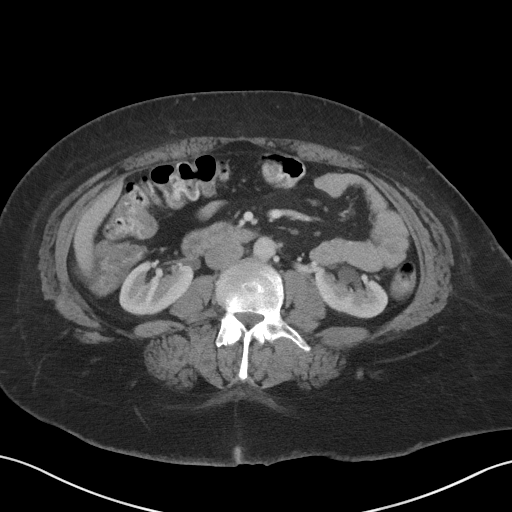
[im 59/86  soft-tissue]
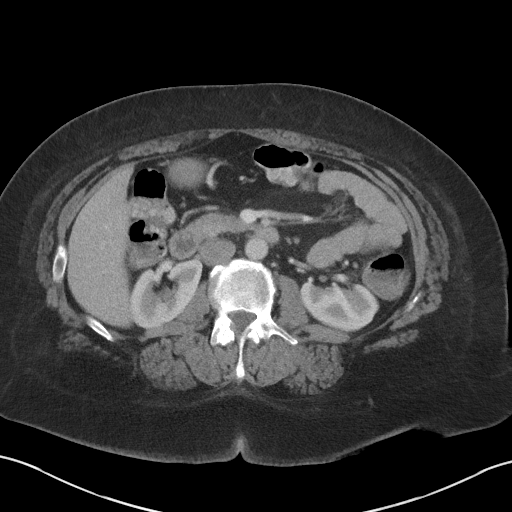
[im 59/86  bone]
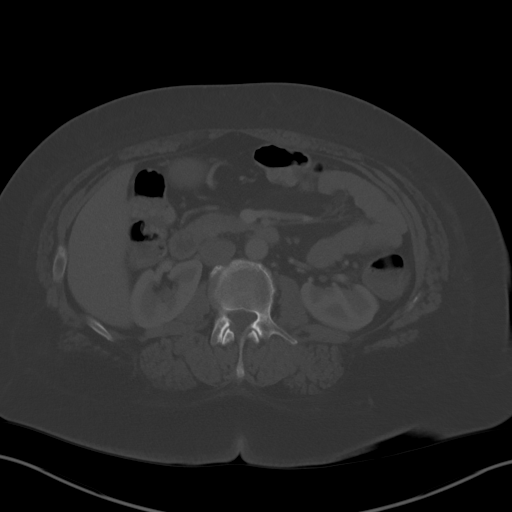
[im 66/86  soft-tissue]
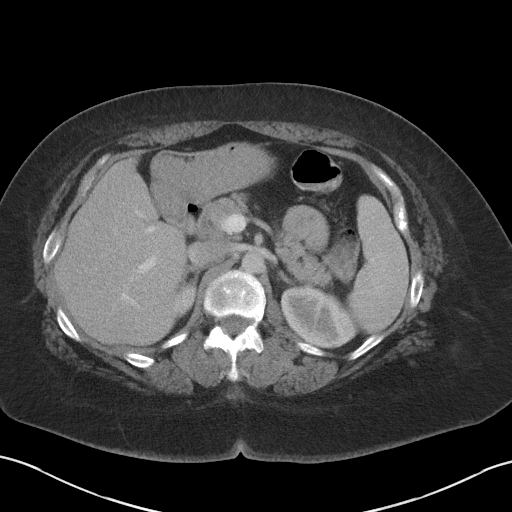
[im 72/86  soft-tissue]
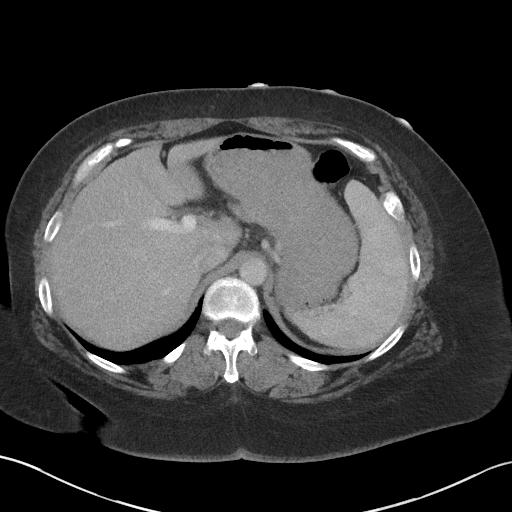
[im 79/86  soft-tissue]
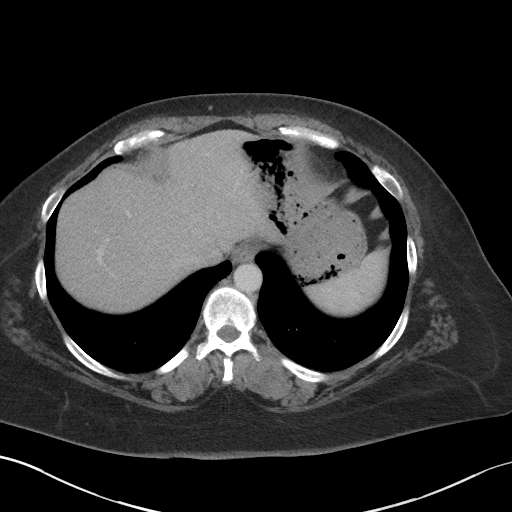

[Series 5: coronal st · coronal · 0.75mm/px · 3 of 89 slices shown]
[im 30/89  soft-tissue]
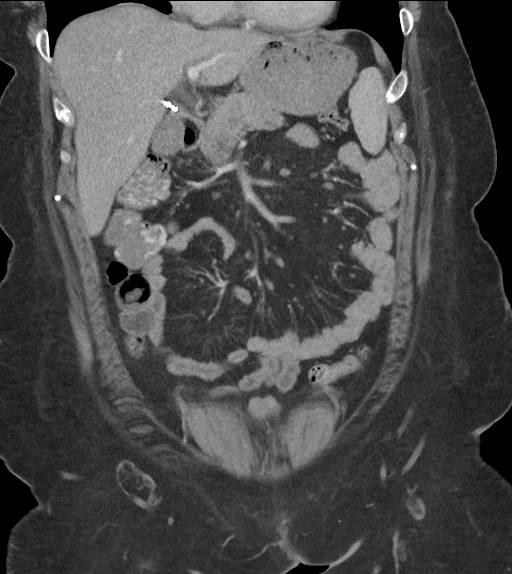
[im 40/89  soft-tissue]
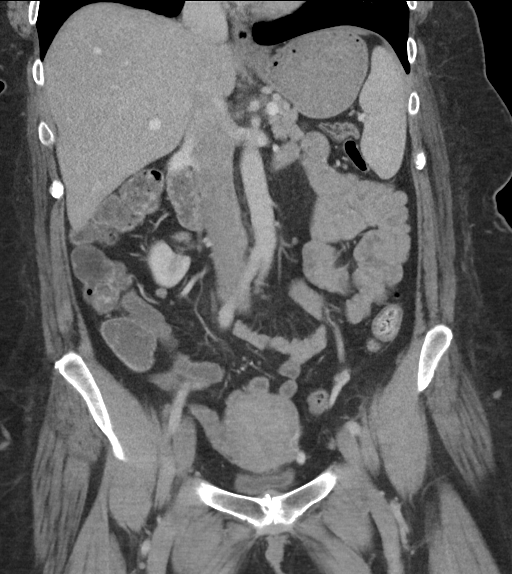
[im 49/89  soft-tissue]
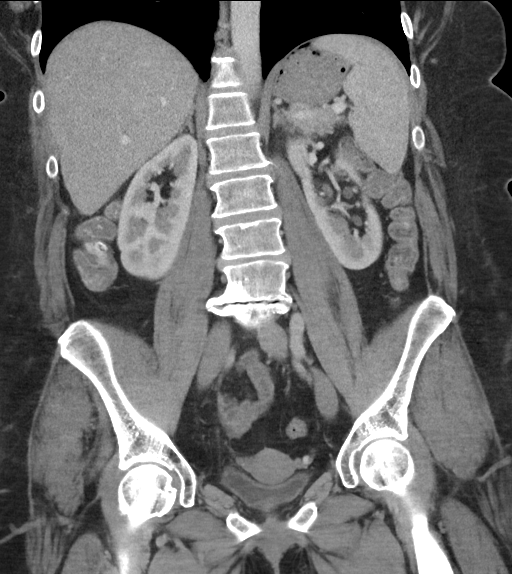

[15 of 46 positions shown; findings below may reference images not displayed]

RADIATION DOSE REDUCTION: This exam was performed according to the
departmental dose-optimization program which includes automated
exposure control, adjustment of the mA and/or kV according to
patient size and/or use of iterative reconstruction technique.

CONTRAST:  100mL OMNIPAQUE IOHEXOL 300 MG/ML  SOLN
FINDINGS: Lower chest: No acute abnormality.

Hepatobiliary: No focal liver abnormality is seen. Status post
cholecystectomy. No biliary dilatation.

Pancreas: Unremarkable. No pancreatic ductal dilatation or
surrounding inflammatory changes.

Spleen: Normal in size without focal abnormality.

Adrenals/Urinary Tract: Adrenal glands are unremarkable. Kidneys are
normal, without renal calculi, focal lesion, or hydronephrosis.
Bladder is unremarkable.

Stomach/Bowel: Stomach is within normal limits. Appendix appears
normal. No evidence of bowel wall thickening, distention, or
inflammatory changes.

Vascular/Lymphatic: No significant vascular findings are present. No
enlarged abdominal or pelvic lymph nodes.

Reproductive: Uterus and bilateral adnexa are unremarkable.

Other: There is a small fat containing umbilical hernia. There is no
ascites.

Musculoskeletal: Degenerative changes affect the spine.
IMPRESSION: 1. No acute localizing process in the abdomen or pelvis.
2. Fat containing umbilical hernia.
3. Cholecystectomy.
